# Patient Record
Sex: Female | Born: 1980
Health system: Southern US, Community
[De-identification: ages and names within clinical notes are randomized; demographics above are authoritative.]

## PROBLEM LIST (undated history)

## (undated) DIAGNOSIS — Z789 Other specified health status: Secondary | ICD-10-CM

## (undated) DIAGNOSIS — B009 Herpesviral infection, unspecified: Secondary | ICD-10-CM

## (undated) HISTORY — PX: ABDOMINAL HYSTERECTOMY: SHX81

---

## 1999-08-23 ENCOUNTER — Other Ambulatory Visit: Admission: RE | Admit: 1999-08-23 | Discharge: 1999-08-23 | Payer: Self-pay | Admitting: Obstetrics

## 1999-09-14 ENCOUNTER — Inpatient Hospital Stay (HOSPITAL_COMMUNITY): Admission: AD | Admit: 1999-09-14 | Discharge: 1999-09-14 | Payer: Self-pay | Admitting: Obstetrics

## 1999-09-14 ENCOUNTER — Emergency Department (HOSPITAL_COMMUNITY): Admission: EM | Admit: 1999-09-14 | Discharge: 1999-09-14 | Payer: Self-pay | Admitting: *Deleted

## 1999-12-28 ENCOUNTER — Inpatient Hospital Stay (HOSPITAL_COMMUNITY): Admission: AD | Admit: 1999-12-28 | Discharge: 1999-12-28 | Payer: Self-pay | Admitting: Obstetrics

## 2000-02-05 ENCOUNTER — Encounter: Payer: Self-pay | Admitting: Obstetrics

## 2000-02-05 ENCOUNTER — Inpatient Hospital Stay (HOSPITAL_COMMUNITY): Admission: AD | Admit: 2000-02-05 | Discharge: 2000-02-05 | Payer: Self-pay | Admitting: Obstetrics

## 2000-03-01 ENCOUNTER — Inpatient Hospital Stay (HOSPITAL_COMMUNITY): Admission: AD | Admit: 2000-03-01 | Discharge: 2000-03-05 | Payer: Self-pay | Admitting: *Deleted

## 2000-03-01 ENCOUNTER — Encounter (INDEPENDENT_AMBULATORY_CARE_PROVIDER_SITE_OTHER): Payer: Self-pay | Admitting: Specialist

## 2000-03-17 ENCOUNTER — Inpatient Hospital Stay (HOSPITAL_COMMUNITY): Admission: AD | Admit: 2000-03-17 | Discharge: 2000-03-17 | Payer: Self-pay | Admitting: Obstetrics

## 2000-08-15 ENCOUNTER — Emergency Department (HOSPITAL_COMMUNITY): Admission: EM | Admit: 2000-08-15 | Discharge: 2000-08-16 | Payer: Self-pay | Admitting: Emergency Medicine

## 2000-10-11 ENCOUNTER — Other Ambulatory Visit: Admission: RE | Admit: 2000-10-11 | Discharge: 2000-10-11 | Payer: Self-pay | Admitting: Obstetrics

## 2001-02-18 ENCOUNTER — Encounter: Payer: Self-pay | Admitting: Obstetrics

## 2001-02-18 ENCOUNTER — Inpatient Hospital Stay (HOSPITAL_COMMUNITY): Admission: AD | Admit: 2001-02-18 | Discharge: 2001-02-18 | Payer: Self-pay | Admitting: Obstetrics

## 2001-03-07 ENCOUNTER — Ambulatory Visit (HOSPITAL_COMMUNITY): Admission: AD | Admit: 2001-03-07 | Discharge: 2001-03-07 | Payer: Self-pay | Admitting: Obstetrics

## 2001-03-07 ENCOUNTER — Encounter (INDEPENDENT_AMBULATORY_CARE_PROVIDER_SITE_OTHER): Payer: Self-pay | Admitting: *Deleted

## 2002-05-01 ENCOUNTER — Encounter: Payer: Self-pay | Admitting: Obstetrics

## 2002-05-01 ENCOUNTER — Ambulatory Visit (HOSPITAL_COMMUNITY): Admission: RE | Admit: 2002-05-01 | Discharge: 2002-05-01 | Payer: Self-pay | Admitting: Obstetrics

## 2002-05-18 ENCOUNTER — Inpatient Hospital Stay (HOSPITAL_COMMUNITY): Admission: AD | Admit: 2002-05-18 | Discharge: 2002-05-22 | Payer: Self-pay | Admitting: Obstetrics

## 2002-05-19 ENCOUNTER — Encounter: Payer: Self-pay | Admitting: Obstetrics

## 2003-11-25 ENCOUNTER — Inpatient Hospital Stay (HOSPITAL_COMMUNITY): Admission: RE | Admit: 2003-11-25 | Discharge: 2003-11-28 | Payer: Self-pay | Admitting: Obstetrics

## 2005-02-13 ENCOUNTER — Emergency Department (HOSPITAL_COMMUNITY): Admission: EM | Admit: 2005-02-13 | Discharge: 2005-02-13 | Payer: Self-pay | Admitting: Emergency Medicine

## 2006-05-04 ENCOUNTER — Ambulatory Visit: Payer: Self-pay | Admitting: Obstetrics and Gynecology

## 2006-05-04 ENCOUNTER — Inpatient Hospital Stay (HOSPITAL_COMMUNITY): Admission: AD | Admit: 2006-05-04 | Discharge: 2006-05-04 | Payer: Self-pay | Admitting: Obstetrics

## 2006-07-25 ENCOUNTER — Inpatient Hospital Stay (HOSPITAL_COMMUNITY): Admission: RE | Admit: 2006-07-25 | Discharge: 2006-07-28 | Payer: Self-pay | Admitting: Obstetrics

## 2010-05-24 ENCOUNTER — Ambulatory Visit (HOSPITAL_COMMUNITY)
Admission: RE | Admit: 2010-05-24 | Discharge: 2010-05-24 | Payer: Self-pay | Source: Home / Self Care | Attending: Obstetrics | Admitting: Obstetrics

## 2010-06-27 ENCOUNTER — Encounter: Payer: Self-pay | Admitting: Obstetrics

## 2010-10-22 NOTE — H&P (Signed)
[facility] of [facility]  Patient:    Lisa Johnson, Lisa Johnson                       MRN: [id number] Adm. Date:  [id number] Attending:  Venita Sheffield                         History and Physical  HISTORY OF PRESENT ILLNESS:   The patient is a 30 year old prima gravida with an [facility] of February 24, 2000 who is in for induction of labor.  She is 1 cm, 50% with a vertex and -2.  Her group B Strep was negative.  She is contracting every four to five minutes.  Amniotomy was performed at 8:30 A.M. on September 26th and she was started in Pitocin stimulation at noon.  Patient contracted two to three minutes apart with quality throughout the day and evening.  By 6 P.M. she was 4 cm and at that time was on 5 U/ml of Pitocin. By 12:30 A.M. on September 27 after having been in good labor throughout the night she was still 4 cm, 90% with a vertex and -2.  It was decided she should deliver by C section because of failure to progress in labor.  PHYSICAL EXAMINATION:  GENERAL APPEARANCE:           Physical exam reveals a well-developed female in labor.  HEENT:                        Negative.  LUNGS:                        Clear.  HEART:                        Regular rhythm.  No murmurs.  No gallops.  ABDOMEN:                      Term size uterus.  Estimated fetal weight is 7 pounds.  PELVIC:                       As described above.  EXTREMITIES:                  Negative. DD:  03/02/00 TD:  03/02/00 Job: [id number]

## 2010-10-22 NOTE — Discharge Summary (Signed)
Lisa Johnson, Lisa Johnson              ACCOUNT NO.:  [id number]   MEDICAL RECORD NO.:  [id number]          PATIENT TYPE:  INP   LOCATION:  9135                          F[facility]ILITY:  [facility]   PHYSICIAN:  Kathreen Cosier, M.D.DATE OF BIRTH:  04/07/1981   DATE OF ADMISSION:  07/25/2006  DATE OF DISCHARGE:  07/28/2006                               DISCHARGE SUMMARY   HISTORY OF PRESENT ILLNESS:  The patient is a 30 year old gravida 5,  para 3-0-1-2, [facility] August 04, 2006.  She had previous C-section and she  was in for repeat C-section.  She had positive GBS.  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 daily.  She also had limited  prenatal care.  Per history, she is a regular marijuana user, and she  has had three previous C-sections.  The patient underwent repeat low  transverse cesarean section.  She had a female with Apgars 9 and 9  weighing 6 pounds 6 ounces.  Postoperatively, she did well.   LABORATORY DATA:  Hemoglobin was 11.4, platelets 228, postop hemoglobin  10.1, platelets 262, PT/PTT normal.  Sodium 136, potassium 3.9, chloride  103.  Urinalysis negative.  HIV negative.   DISPOSITION:  She was discharged home on postop day #3 on a regular diet  and Tylox for pain.  She is to see me in six weeks.   DISCHARGE DIAGNOSES:  Status post repeat low transverse cesarean section  at term.           ______________________________  Kathreen Cosier, M.D.     BAM/MEDQ  D:  08/16/2006  T:  08/17/2006  Job:  [id number]

## 2010-10-22 NOTE — Op Note (Signed)
NAMESALIAH, CRISP              ACCOUNT NO.:  [id number]   MEDICAL RECORD NO.:  [id number]          PATIENT TYPE:  INP   LOCATION:  NA                            [facility]   PHYSICIAN:  Kathreen Cosier, M.D.DATE OF BIRTH:  1981-02-06   DATE OF PROCEDURE:  07/25/2006  DATE OF DISCHARGE:                               OPERATIVE REPORT   PREOPERATIVE DIAGNOSIS:  Previous cesarean section, at term, for repeat.   OPERATION:  Repeat cesarean section.   SURGEON:  Kathreen Cosier, M.D.   FIRST ASSISTANT:  Charles A. Clearance Coots, M.D.   ANESTHESIA:  Spinal.   DESCRIPTION OF PROCEDURE:  The patient was placed on the operating room  in a supine position after the spinal was administered by Dr. Arby Barrette,  abdomen prepped and draped, bladder emptied with a Foley catheter.  A  transverse suprapubic incision was made and carried down to the rectus  fascia, fascia cleaned and incised the length of the incision, rectus  muscles retracted laterally and peritoneum incised longitudinally.  A  transverse incision was made in the visceroperitoneum above the bladder  and bladder mobilized inferiorly.  A transverse lower uterine incision  was made and patient delivered from LOA position of a female, Apgars 9 and  9, weighing 6 pounds 6 ounces.  The team was in attendance.  The  placenta was anterior, removed manually and sent to Pathology, uterine  cavity cleaned with dry laps.  The uterine incision was closed in 1  layer with continuous suture of #1 chromic, hemostasis satisfactory,  bladder flap reattached with 2-0 chromic, uterus well-contracted, tubes  and ovaries normal.  The abdomen was closed in layers, the peritoneum  with continuous suture of 0 chromic, fascia with continuous suture of 0  Dexon and the skin closed with a subcuticular stitch of 4-0 Monocryl.   BLOOD LOSS:  500 mL.           ______________________________  Kathreen Cosier, M.D.     BAM/MEDQ  D:  07/25/2006   T:  07/25/2006  Job:  [id number]

## 2010-10-22 NOTE — Op Note (Signed)
[facility] of [facility]  Patient:    Lisa Johnson, Lisa Johnson Visit Number: [id number] MRN: [id number]          Service Type: Attending:  Kathreen Cosier, M.D. Dictated by:   Kathreen Cosier, M.D. Proc. Date: 03/07/01                             Operative Report  PREOPERATIVE DIAGNOSES:       Spontaneous incomplete abortion.  PROCEDURE:                    Dilatation and evacuation using MAC.  DESCRIPTION OF PROCEDURE:     Patient in lithotomy position.  Perineum and vagina prepped and draped.  Bladder emptied with straight catheter.  Bimanual examination revealed uterus up to 6-8 weeks size.  Weighted speculum placed in the vagina.  Anterior lip of the cervix grasped with a tenaculum.  Cervix ______ opened and the cavity was found at 9 cm.  Using a #9 suction the endometrial contents were aspirated until the cavity was clean.  Patient tolerated procedure well.  Taken to recovery room in good condition. Dictated by:   Kathreen Cosier, M.D. Attending:  Kathreen Cosier, M.D. DD:  03/07/01 TD:  03/07/01 Job: [id number]

## 2010-10-22 NOTE — Discharge Summary (Signed)
[facility] of [facility]  Patient:    Lisa Johnson, Lisa Johnson                       MRN: [id number] Adm. Date:  [id number] Disc. Date: [id number] Attending:  Shelba Flake                           Discharge Summary  HISTORY OF PRESENT ILLNESS:   The patient is a 30 year old primigravida, [facility] February 24, 2000, who was admitted for induction of labor.  She was 1 cm, vertex -2, Strep negative.  HOSPITAL COURSE:              The patient underwent a primary low transverse cesarean section because of failure to progress in labor and had a 7 pound 15 ounce female, Apgars 8 and 9.  She did well except for temperature elevation postoperatively which was treated with ampicillin and gentamicin IV.  On admission her hemoglobin was 10.8, postoperative 8.1, white count 14.4 and 14.5, platelets 227 and 203.  Her lab tests shows positive ______.  The patient defervesed by day #4 and demanded to be discharged.  She was discharged home on ampicillin 500 p.o. q.6h. and to be followed up in my office two days postdischarge.  DISCHARGE DIAGNOSIS:          Status post primary low transverse cesarean section at term for failure to progress in labor. DD:  08/17/00 TD:  08/17/00 Job: [id number]

## 2010-10-22 NOTE — Op Note (Signed)
NAMECASARA, PERRIER                        ACCOUNT NO.:  [id number]   MEDICAL RECORD NO.:  [id number]                   PATIENT TYPE:  INP   LOCATION:  9199                                 [facility]   PHYSICIAN:  Kathreen Cosier, M.D.           DATE OF BIRTH:  [birth year]   DATE OF PROCEDURE:  11/25/2003  DATE OF DISCHARGE:                                 OPERATIVE REPORT   PREOPERATIVE DIAGNOSES:  Previous cesarean section at term x2 desiring  repeat.   POSTOPERATIVE DIAGNOSES:  Previous cesarean section at term x2 desiring  repeat.   SURGEON:  Kathreen Cosier, M.D.   FIRST ASSISTANT:  Charles A. Clearance Coots, M.D.   ANESTHESIA:  Spinal.   DESCRIPTION OF PROCEDURE:  The patient on the operating table in the supine  position. After the spinal administered, abdomen prepped and draped, bladder  emptied with a Foley catheter. A transverse suprapubic incision made through  the old scar and carried down to the rectus fascia.  The fascia cleaned and  incised the length of the incision. The recti muscles were retracted  laterally, peritoneum incised longitudinally. A transverse incision made in  the visceroperitoneum above the bladder and the bladder mobilized  inferiorly. A transverse lower uterine incision made, the fluid was clear.  The patient delivered from the LOA position of a female with Apgar 9, 9  weighing 7 pounds 1 ounce. The placenta was posterior, removed manually.  The uterine cavity cleaned with dry laps.  The uterine incision closed in  one layer with continuous suture of #1 chromic, hemostasis was satisfactory.  The bladder flap reattached with 2-0 chromic, uterus was retracted,  tubes  and ovaries normal. Abdomen closed in layers, peritoneum continuous with  __________ chromic, fascia continuous suture of #0 Dexon and the skin closed  with subcuticular stitch of 3-0 Monocryl.  Blood loss 600 mL.                                               Kathreen Cosier, M.D.    BAM/MEDQ  D:  11/25/2003  T:  11/25/2003  Job:  [id number]

## 2010-10-22 NOTE — H&P (Signed)
   NAME:  Lisa Johnson, Lisa Johnson                        ACCOUNT NO.:  [id number]   MEDICAL RECORD NO.:  [id number]                   PATIENT TYPE:  INP   LOCATION:  9134                                 [facility]   PHYSICIAN:  Kathreen Cosier, M.D.           DATE OF BIRTH:  1980-07-24   DATE OF ADMISSION:  05/18/2002  DATE OF DISCHARGE:                                HISTORY & PHYSICAL   HISTORY OF PRESENT ILLNESS:  The patient is a 30 year old gravida 3, para 1-  0-0-1 who had a previous cesarean section.  Her EDC was May 18, 2002,  and she was brought in for elective induction.  She had positive GBS treated  in the office with ampicillin for a total of five weeks.  Positive herpes,  on Valtrex for 36 weeks with no outbreaks.   On admission, her cervix was 1-2 cm, 50% vertex, -3 and cervix very  posterior.  Artificial rupture of membranes was performed at 8 p.m.  The  patient received Pitocin stimulation on May 19, 2002,  and she was 4 cm, 80%, vertex -1.  Fluid was clear.  She had an epidural.  By 9:30 p.m., she was 5 cm, 100%, with the vertex at -1.  She was having  some variables and some lates.  The IUPC was inserted, and amnioinfusion  began.  The lates and variables disappeared.  By 11:30 a.m., the cervix was  still 5, what was 100 was now 70%, vertex -1.  She started having some late  decelerations again, and there had been no change in the cervix in the past  five hours  It was decided that she would be delivered by a repeat C-section  for failure to progress and nonreassuring fetal heart rate tracing.   PHYSICAL EXAMINATION:  GENERAL:  Well-developed female in labor.  HEENT:  Negative.  LUNGS:  Clear.  HEART:  Regular rhythm.  No murmurs or gallops.  ABDOMEN:  Term size uterus.  PELVIC:  As described above.  EXTREMITIES:  Negative.                                                Kathreen Cosier, M.D.    BAM/MEDQ  D:  05/19/2002  T:  05/19/2002  Job:   [id number]

## 2010-10-22 NOTE — Discharge Summary (Signed)
Lisa Johnson, Lisa Johnson                        ACCOUNT NO.:  [id number]   MEDICAL RECORD NO.:  [id number]                   PATIENT TYPE:  INP   LOCATION:  9108                                 [facility]   PHYSICIAN:  Kathreen Cosier, M.D.           DATE OF BIRTH:  1980-09-29   DATE OF ADMISSION:  11/25/2003  DATE OF DISCHARGE:                                 DISCHARGE SUMMARY   HOSPITAL COURSE:  The patient is a 30 year old gravida 4 para 2-0-1-2 with  [facility] November 27, 2003.  She had two previous C-sections and she had limited  prenatal care.  She was admitted for repeat C-section.  The patient had a  repeat low transverse cesarean section, had a female, Apgar 9 and 9,  weighing 7 pounds 10 ounces.  Postoperatively she did well.  She was  discharged on postoperative day #3 ambulatory, on a regular diet.  Her  hemoglobin was 11.6.   DISCHARGE MEDICATIONS:  Depo-Proveral and Tylox.                                               Kathreen Cosier, M.D.    BAM/MEDQ  D:  11/28/2003  T:  11/29/2003  Job:  [id number]

## 2010-10-22 NOTE — Discharge Summary (Signed)
   NAMESIMORA, DINGEE                        ACCOUNT NO.:  [id number]   MEDICAL RECORD NO.:  [id number]                   PATIENT TYPE:  INP   LOCATION:  9128                                 [facility]   PHYSICIAN:  Kathreen Cosier, M.D.           DATE OF BIRTH:  November 08, 1980   DATE OF ADMISSION:  05/18/2002  DATE OF DISCHARGE:  05/22/2002                                 DISCHARGE SUMMARY   HISTORY OF PRESENT ILLNESS:  The patient is a 30 year old gravida 3, para 1-  0-0-1, EDD of 05/18/02 who had a previous C-section, scheduled for elective  induction at term.   HOSPITAL COURSE:  The patient progressed to 5 cm and her progress ceased and  she underwent a repeat low transverse cesarean section, had a female, Apgars  of 8 and 9, weighing 7 pounds 3 ounces from the OP position.  Placenta was  anterior.  Her hemoglobin on admission was 10, postoperative 7.9.  She was  discharged home on the third postoperative day and tolerating a regular diet  on Tylox one to two every three to four hours and ferrous sulfate 325, to  see me in six weeks. She also received Depakote 150 mg IM.   DISCHARGE DIAGNOSIS:  Status post repeat low transverse cesarean section.                                               Kathreen Cosier, M.D.    BAM/MEDQ  D:  05/22/2002  T:  05/23/2002  Job:  [id number]

## 2010-10-22 NOTE — Op Note (Signed)
   NAMEVERNELLA, Lisa Johnson                        ACCOUNT NO.:  [id number]   MEDICAL RECORD NO.:  [id number]                   PATIENT TYPE:  INP   LOCATION:  9134                                 [facility]   PHYSICIAN:  Kathreen Cosier, M.D.           DATE OF BIRTH:  27-Dec-1980   DATE OF PROCEDURE:  05/19/2002  DATE OF DISCHARGE:                                 OPERATIVE REPORT   PREOPERATIVE DIAGNOSES:  1. Failure to progress.  2. Nonreassuring fetal heart rate tracing.   ANESTHESIA:  Epidural.   DESCRIPTION OF PROCEDURE:  The patient was placed on the operating table in  supine position.  Abdomen prepped and draped, the bladder emptied with a  Foley catheter.  A transverse suprapubic incision made, carried down to the  rectus fascia.  The fascia cleaned and incised the length of the incision.  The recti muscles retracted laterally.  The peritoneum was advanced  longitudinally.  A transverse incision made in the visceral peritoneum above  the bladder and the bladder mobilized.  A transverse low uterine incision  made.  The patient was delivered from the OP position of a female, Apgar 8 and  9, weighing 7 pounds 7 ounces.  The placenta was anterior and removed  manually.  The uterine cavity cleaned with dry laps.  The uterine incision  closed in our layer with continuous suture of #1 chromic.  Hemostasis was  satisfactory.  Bladder flap reattached with 2-0 chromic.  The uterus well-  contracted, tubes and ovaries normal.  Abdomen closed in layers, the  peritoneum continuous suture of 0 chromic, the fascia continuous suture of 0  Dexon, and the skin closed with subcuticular suture of 3-0 plain.  The  patient tolerated the procedure well, taken to the recovery room in good  condition.                                               Kathreen Cosier, M.D.    BAM/MEDQ  D:  05/19/2002  T:  05/19/2002  Job:  [id number]

## 2010-10-22 NOTE — Op Note (Signed)
[facility] of [facility]  Patient:    Lisa Johnson, Lisa Johnson                       MRN: [id number] Proc. Date: 03/02/00 Adm. Date:  [id number] Attending:  Venita Sheffield                           Operative Report  PREOPERATIVE DIAGNOSIS:       Failure to progress in labor.  POSTOPERATIVE DIAGNOSIS:      Failure to progress in labor.  PROCEDURE:                    Transverse cesarean section.  SURGEON:                      Kathreen Cosier, M.D.  ANESTHESIA:                   Epidural.  DESCRIPTION OF PROCEDURE:     The patient was taken to the operating room and placed on the table in supine position.  The abdomen was prepped and draped. The bladder was emptied with a Foley catheter.  A transverse suprapubic incision was made and carried down to the rectus fascia.  The fascia was cleanly incised and the erectile muscles were retracted laterally.  The peritoneum was incised longitudinally.  A transverse incision was then made in the viscera peritoneum above the bladder, and the bladder mobilized inferiorly.  A transverse lower uterine incision was made.  The patient was then delivered from the OP position of a female Apgar 8, 9 weighing 7 pounds 15 ounces.  The fluid was cleared.  The pediatric team was in attendance.  There was a loose loop of cord.  The placenta was anterior and removed manually.  Uterine cavity was cleaned with dry laps.  The uterine incision was closed with continuous suture of #1 chromic, including myometrium and endometrium.  The myometrium was closed in one layer.  Hemostasis was satisfactory.  The bladder flap was reattached with 2-0 chromic.  The uterus was contracted.  The tubes and ovaries were normal.  The abdomen was closed in layers.   The peritoneum in continuous 0 chromic.  Subcutaneous with 2-0 Dexon, and the skin closed with subcuticular stitch of 3-0 plain.  It was noted that there was a small suprapubic inclusion  cyst; using a scalpel this was removed and then closed with one suture of 4-0 Dexon.  ESTIMATED BLOOD LOSS:  Total 500 cc.  DISPOSITION:  The patient tolerated the procedure well.   DD:  03/02/00 TD:  03/02/00 Job: [id number]

## 2011-01-28 ENCOUNTER — Emergency Department (HOSPITAL_COMMUNITY)
Admission: EM | Admit: 2011-01-28 | Discharge: 2011-01-28 | Disposition: A | Discharge status: Home / Self Care | Payer: Medicaid Other | Attending: Emergency Medicine | Admitting: Emergency Medicine

## 2011-01-28 DIAGNOSIS — IMO0002 Reserved for concepts with insufficient information to code with codable children: Secondary | ICD-10-CM | POA: Insufficient documentation

## 2011-01-28 DIAGNOSIS — L732 Hidradenitis suppurativa: Secondary | ICD-10-CM | POA: Insufficient documentation

## 2011-01-30 ENCOUNTER — Emergency Department (HOSPITAL_COMMUNITY)
Admission: EM | Admit: 2011-01-30 | Discharge: 2011-01-30 | Discharge status: Against Medical Advice | Payer: Medicaid Other | Attending: Emergency Medicine | Admitting: Emergency Medicine

## 2011-01-30 DIAGNOSIS — Z0389 Encounter for observation for other suspected diseases and conditions ruled out: Secondary | ICD-10-CM | POA: Insufficient documentation

## 2012-08-28 ENCOUNTER — Other Ambulatory Visit (HOSPITAL_COMMUNITY): Payer: Self-pay | Admitting: Obstetrics

## 2012-08-28 DIAGNOSIS — Z3480 Encounter for supervision of other normal pregnancy, unspecified trimester: Secondary | ICD-10-CM

## 2012-08-28 DIAGNOSIS — D219 Benign neoplasm of connective and other soft tissue, unspecified: Secondary | ICD-10-CM

## 2012-09-03 ENCOUNTER — Ambulatory Visit (HOSPITAL_COMMUNITY)
Admission: RE | Admit: 2012-09-03 | Discharge: 2012-09-03 | Disposition: A | Discharge status: Home / Self Care | Payer: Medicaid Other | Source: Ambulatory Visit | Attending: Obstetrics | Admitting: Obstetrics

## 2012-09-03 ENCOUNTER — Encounter (HOSPITAL_COMMUNITY): Payer: Self-pay

## 2012-09-03 ENCOUNTER — Other Ambulatory Visit (HOSPITAL_COMMUNITY): Payer: Self-pay | Admitting: Obstetrics

## 2012-09-03 DIAGNOSIS — Z3480 Encounter for supervision of other normal pregnancy, unspecified trimester: Secondary | ICD-10-CM

## 2012-09-03 DIAGNOSIS — D219 Benign neoplasm of connective and other soft tissue, unspecified: Secondary | ICD-10-CM

## 2012-09-03 DIAGNOSIS — O3410 Maternal care for benign tumor of corpus uteri, unspecified trimester: Secondary | ICD-10-CM | POA: Insufficient documentation

## 2012-09-03 DIAGNOSIS — Z3689 Encounter for other specified antenatal screening: Secondary | ICD-10-CM | POA: Insufficient documentation

## 2012-09-03 DIAGNOSIS — D259 Leiomyoma of uterus, unspecified: Secondary | ICD-10-CM | POA: Insufficient documentation

## 2012-09-11 ENCOUNTER — Ambulatory Visit (HOSPITAL_COMMUNITY): Payer: Medicaid Other

## 2013-05-24 ENCOUNTER — Other Ambulatory Visit: Payer: Self-pay | Admitting: Internal Medicine

## 2013-05-24 DIAGNOSIS — R109 Unspecified abdominal pain: Secondary | ICD-10-CM

## 2013-06-17 ENCOUNTER — Ambulatory Visit
Admission: RE | Admit: 2013-06-17 | Discharge: 2013-06-17 | Disposition: A | Discharge status: Home / Self Care | Payer: Medicaid Other | Source: Ambulatory Visit | Attending: Internal Medicine | Admitting: Internal Medicine

## 2013-06-17 DIAGNOSIS — R109 Unspecified abdominal pain: Secondary | ICD-10-CM

## 2013-08-03 ENCOUNTER — Encounter (HOSPITAL_COMMUNITY): Payer: Self-pay | Admitting: Emergency Medicine

## 2013-08-03 ENCOUNTER — Emergency Department (HOSPITAL_COMMUNITY): Payer: Medicaid Other

## 2013-08-03 ENCOUNTER — Emergency Department (HOSPITAL_COMMUNITY)
Admission: EM | Admit: 2013-08-03 | Discharge: 2013-08-03 | Disposition: A | Discharge status: Home / Self Care | Payer: Medicaid Other | Attending: Emergency Medicine | Admitting: Emergency Medicine

## 2013-08-03 DIAGNOSIS — S46909A Unspecified injury of unspecified muscle, fascia and tendon at shoulder and upper arm level, unspecified arm, initial encounter: Secondary | ICD-10-CM | POA: Insufficient documentation

## 2013-08-03 DIAGNOSIS — Y9389 Activity, other specified: Secondary | ICD-10-CM | POA: Insufficient documentation

## 2013-08-03 DIAGNOSIS — Y92410 Unspecified street and highway as the place of occurrence of the external cause: Secondary | ICD-10-CM | POA: Insufficient documentation

## 2013-08-03 DIAGNOSIS — M25519 Pain in unspecified shoulder: Secondary | ICD-10-CM

## 2013-08-03 DIAGNOSIS — S4980XA Other specified injuries of shoulder and upper arm, unspecified arm, initial encounter: Secondary | ICD-10-CM | POA: Insufficient documentation

## 2013-08-03 DIAGNOSIS — F17200 Nicotine dependence, unspecified, uncomplicated: Secondary | ICD-10-CM | POA: Insufficient documentation

## 2013-08-03 MED ORDER — OXYCODONE-ACETAMINOPHEN 5-325 MG PO TABS
1.0000 | ORAL_TABLET | Freq: Once | ORAL | Status: AC
  Administered 2013-08-03: 1 via ORAL
  Filled 2013-08-03: qty 1

## 2013-08-03 MED ORDER — IBUPROFEN 600 MG PO TABS: 600.0000 mg | ORAL_TABLET | Freq: Four times a day (QID) | ORAL | Status: AC | PRN

## 2013-08-03 MED ORDER — OXYCODONE-ACETAMINOPHEN 5-325 MG PO TABS: 1.0000 | ORAL_TABLET | Freq: Four times a day (QID) | ORAL | Status: AC | PRN

## 2013-08-03 NOTE — Discharge Instructions (Signed)
Return to the ED with any concerns including increased pain, swelling/discoloration/numbness of hand or fingers, or any other alarming symptoms
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
## 2014-02-12 ENCOUNTER — Telehealth: Payer: Self-pay | Admitting: *Deleted

## 2014-02-12 NOTE — Telephone Encounter (Signed)
Called pt to [facility] appt for new ob and pt stated to have an appt in another location on 09/22.  [place]

## 2014-02-21 ENCOUNTER — Ambulatory Visit (HOSPITAL_COMMUNITY): Payer: No Typology Code available for payment source

## 2014-02-25 ENCOUNTER — Other Ambulatory Visit (HOSPITAL_COMMUNITY): Payer: Self-pay | Admitting: Obstetrics

## 2014-02-25 ENCOUNTER — Other Ambulatory Visit: Payer: Self-pay

## 2014-02-25 ENCOUNTER — Ambulatory Visit (HOSPITAL_COMMUNITY)
Admission: RE | Admit: 2014-02-25 | Discharge: 2014-02-25 | Disposition: A | Discharge status: Home / Self Care | Payer: Medicaid Other | Source: Ambulatory Visit | Attending: Obstetrics | Admitting: Obstetrics

## 2014-02-25 ENCOUNTER — Encounter (HOSPITAL_COMMUNITY): Payer: Self-pay

## 2014-02-25 DIAGNOSIS — E7529 Other sphingolipidosis: Secondary | ICD-10-CM

## 2014-02-25 DIAGNOSIS — O09529 Supervision of elderly multigravida, unspecified trimester: Secondary | ICD-10-CM | POA: Diagnosis not present

## 2014-02-25 DIAGNOSIS — O352XX0 Maternal care for (suspected) hereditary disease in fetus, not applicable or unspecified: Secondary | ICD-10-CM | POA: Diagnosis present

## 2014-02-25 DIAGNOSIS — O352XX1 Maternal care for (suspected) hereditary disease in fetus, fetus 1: Secondary | ICD-10-CM

## 2014-02-25 LAB — FRAGILE X FOR MFM (SENT TO WFUBMC)

## 2014-02-27 ENCOUNTER — Telehealth (HOSPITAL_COMMUNITY): Payer: Self-pay | Admitting: MS"

## 2014-02-27 NOTE — Telephone Encounter (Signed)
Left message for patient to return call to discuss additional information following up from her visit on Tuesday.   Lisa Johnson 02/27/2014 10:46 AM

## 2014-02-27 NOTE — Telephone Encounter (Signed)
Called Ms. 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 Discussed that given that the lab is in [place], they do not accept [place] Medicaid, thus the patient may be billed directly for the testing. Reviewed that the test costs $990. Ms. Kenealy stated that she does not have that amount of money to pay for the testing and that she guessed she would be "waiting it out." Discussed that I could inquire about payment plan, if desired. Ms. Silveria asked if we could still test for the other conditions. Reviewed that chromosome analysis would be performed through [facility]. Also discussed the option of noninvasive prenatal screening (NIPS) for chromosome conditions and the benefits and limitations compared to [facility]. Ms. Mcquown stated that she needed time to think over these options and would call me back. Reviewed that she can also still come in tomorrow for the scheduled time and that we can review this information in person.   [name] Patt Steinhardt 02/27/2014 4:13 PM

## 2014-02-27 NOTE — Telephone Encounter (Signed)
Patient returned call. Discussed with Ms. Lisa Johnson that I was calling to follow up regarding records of her son, [name], genetic testing results. 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 Per telephone conversation with the lab director, [name], [name]'s blood that was also submitted with her son's was positive for the second identified mutation. [name] blood that was submitted with their son's sample was negative for both mutations, per Dr. Carren Rang verbal report. We discussed that he does not appear to be a carrier for MLD according to the testing performed in this laboratory. We reviewed that various explanations for this include nonpaternity, de novo mutation, and germ line mosaicism. [name] stated that he is definitely the dad for Kendred and the current pregnancy. We reviewed that given that he reportedly was not found to carry either mutation, the pregnancy would not be expected to have a 1 in 4 (25%) chance for MLD. Recurrence risk would be expected to be very low. We reviewed that molecular testing can still be performed on CVS (or amniocentesis) if desired, and that a different laboratory, [facility] in [place] is able to perform prenatal testing. We reviewed the 1 in 812 risk for complications from CVS. We discussed that each of her children have a 1 in 2 (50%) chance to be a carrier. [name] stated that she would like to proceed with CVS given that she is still anxious about the risk for recurrence.   [name] [name] 02/27/2014 4:09 PM

## 2014-02-27 NOTE — Telephone Encounter (Signed)
Called Ms. Tiernan L Bourque back regarding billing for [facility]S for metachromatic leukodystrophy and explained that we can do institutional billing for the sample. She stated that she is definitely still interested in doing CVS given that "she has it." Reviewed that if she is a carrier but if the father of the pregnancy is not, then the pregnancy would not likely be at increased risk for MLD. However, reviewed that if she pursues CVS, the lab performing molecular testing for MLD also plans to test her and father of the pregnancy's blood for the reported identified mutations to confirm the previous report. Parental samples are used as positive controls. Thus, [name], the father of the pregnancy would need to submit blood. The patient indicated that he may not be able to come tomorrow. Reviewed that it can be performed at a later date given that cells will first be cultured and won't be sent to for MLD testing for approximately 2 weeks. Patient had no additional questions at this time.   [name] Nera Haworth 02/27/2014 4:44 PM

## 2014-02-28 ENCOUNTER — Other Ambulatory Visit: Payer: Self-pay

## 2014-02-28 ENCOUNTER — Ambulatory Visit (HOSPITAL_COMMUNITY)
Admission: RE | Admit: 2014-02-28 | Discharge: 2014-02-28 | Disposition: A | Discharge status: Home / Self Care | Payer: Medicaid Other | Source: Ambulatory Visit | Attending: Obstetrics | Admitting: Obstetrics

## 2014-02-28 ENCOUNTER — Encounter (HOSPITAL_COMMUNITY): Payer: Self-pay

## 2014-02-28 ENCOUNTER — Telehealth (HOSPITAL_COMMUNITY): Payer: Self-pay | Admitting: MS"

## 2014-02-28 VITALS — BP 110/70 | HR 92 | Wt 176.0 lb

## 2014-02-28 DIAGNOSIS — O9989 Other specified diseases and conditions complicating pregnancy, childbirth and the puerperium: Secondary | ICD-10-CM

## 2014-02-28 DIAGNOSIS — O99891 Other specified diseases and conditions complicating pregnancy: Secondary | ICD-10-CM | POA: Insufficient documentation

## 2014-02-28 DIAGNOSIS — O352XX0 Maternal care for (suspected) hereditary disease in fetus, not applicable or unspecified: Secondary | ICD-10-CM | POA: Diagnosis present

## 2014-02-28 DIAGNOSIS — E7529 Other sphingolipidosis: Secondary | ICD-10-CM | POA: Insufficient documentation

## 2014-02-28 DIAGNOSIS — O352XX1 Maternal care for (suspected) hereditary disease in fetus, fetus 1: Secondary | ICD-10-CM

## 2014-02-28 DIAGNOSIS — G3180 Leukodystrophy, unspecified: Secondary | ICD-10-CM | POA: Insufficient documentation

## 2014-02-28 LAB — ROUTINE CHROMOSOME - KARYOTYPE

## 2014-02-28 NOTE — Telephone Encounter (Signed)
Called Mr. Lisa Johnson, the father of the pregnancy to review that paternal blood samples are needed with the CVS performed this morning given that molecular testing is being performed. He stated that he could probably come by the beginning of next week and asked if he could call our office when he is able to come. Provided him with the contact number and discussed that our office hours are 8-5. Encouraged him to contact me with any questions.   [name] Chayah Mckee 02/28/2014 10:54 AM

## 2014-03-04 ENCOUNTER — Telehealth (HOSPITAL_COMMUNITY): Payer: Self-pay | Admitting: MS"

## 2014-03-04 ENCOUNTER — Encounter (HOSPITAL_COMMUNITY): Payer: Self-pay

## 2014-03-04 DIAGNOSIS — O352XX0 Maternal care for (suspected) hereditary disease in fetus, not applicable or unspecified: Secondary | ICD-10-CM | POA: Insufficient documentation

## 2014-03-04 NOTE — Progress Notes (Signed)
Genetic Counseling  High-Risk Gestation Note  Appointment Date:  02/25/2014 Referred By: Frederico Hamman, MD Date of Birth:  03/21/1981   Pregnancy History: [id number] Estimated Date of Delivery: 09/02/14 Estimated Gestational Age: [redacted]w[redacted]d  I met with Lisa Johnson genetic counseling because of a previous child with metachromatic leukodystrophy. She was accompanied by her sister to today's visit. Ms. CPartridgewill be 33years old at delivery.    We began by reviewing the family history in detail. The patient reported that her son, Lisa Johnson was born in 2008 and was diagnosed with metachromatic leukodystrophy at age 33years. He was diagnosed and followed by neurology. He died at age 35and a h35years old. She reported that this was the third child together with her current partner, Lisa Johnson Their second child, a daughter, died from Sudden Infant Death syndrome. She gave u[place]oreapermission to obtain and review medical records from her son's treatment, which confirmed the reported diagnosis. Medical records indicated that his diagnosis was determined through enzyme analysis and confirmed through urine analysis 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 No additional relatives were reported with metachromatic leukodystrophy. The patient reported that she and Mr. KGeorgina Peerare anxious about the possibility for MLD to occur again in a pregnancy, given this history.   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 Three clinical subtypes have been described: late-infantile MLD (50-60% of cases), juvenile MLD (20-30% of cases), and adult MLD (15-20% of cases). The age of onset has been reported to typically be similar within a family. Late-infantile MLD is characterized by onset between ages  one and two years. Characteristics may include weakness, hypotonia, clumsiness, toe walking, frequent falls, and slurred speech. Characteristics seen with progression of the condition include inability to stand, difficulty with speech, deteriorated mental function, hypertonia, arm and leg pain, seizures, poor vision and hearing, and peripheral neuropathy. Children in the final stages of MLD reportedly have tonic spasms, decerebrate posturing, and general unawareness of surroundings.   It is caused by a deficiency of arylsulfatase A (ARSA).  However, pseudodeficiency of ARSA also occurs. Thus, diagnosis of MLD is confirmed by urinary secretion of sulfatides, finding of metachromatic lipid deposits in nervous system tissue, and/or genetic testing of ARSA gene. MLD follows autosomal recessive inheritance, and ARSA is the only gene identified in which a mutation causes MLD. We reviewed genes, chromosomes, and autosomal recessive inheritance. A pregnancy is at risk to inherit the autosomal recessive condition if both parents are carriers of one copy of the nonworking gene change. Each pregnancy of a carrier couple has a 1 in 4 (25%) chance to be affected, 1 in 2 (50%) chance to be a carrier, and a 1 in 4 (25%) chance to be neither affected nor a carrier.    We discussed the option of prenatal diagnosis via chorionic villus sampling (CVS) at 10-[redacted] weeks gestation or amniocentesis (after [redacted] weeks gestation) for MLD, given that molecular testing previously identified causative mutations for the affected individual in the family. We reviewed the risks, benefits, and limitations of CVS and amniocentesis, including the associated 1 in 1701risk for complications for CVS and the 1 in 3779-390risk for complications from amniocentesis, including spontaneous pregnancy loss for both. We reviewed that karyotype analysis can also be performed via CVS and amniocentesis. We reviewed that CVS and  amniocentesis do not detect or rule  out all genetic conditions or birth defects. Additionally, when two parents are carriers and the mutations are identified, preimplantation genetic diagnosis would also be an option for a future pregnancy, meaning that embryo(s) obtained through in vitro fertilization would be tested for the familial mutations prior to the implantation of unaffected embryo(s) into the uterus. After careful consideration, Lisa Johnson elected to proceed with CVS for chromosome analysis and molecular testing for MLD (for the two mutations identified in her previous son). CVS was scheduled for 02/28/14 with Dr. Jolyn Lent in our office.   They were counseled regarding maternal age and the association with risk for chromosome conditions due to nondisjunction with aging of the ova.   We reviewed chromosomes, nondisjunction, and the associated 1 in 114 risk for fetal aneuploidy in the first trimester related to a maternal age of 33 years old at delivery.  They were counseled that the risk for aneuploidy decreases as gestational age increases, accounting for those pregnancies which spontaneously abort.  We specifically discussed Down syndrome (trisomy 53), trisomies 40 and 49, and sex chromosome aneuploidies (47,XXX and 47,XXY) including the common features and prognoses of each.   We reviewed available screening options including First Screen, Quad screen, noninvasive prenatal screening (NIPS)/cell free DNA (cfDNA) testing, and detailed ultrasound.  She was counseled that screening tests are used to modify a patient's a priori risk for aneuploidy, typically based on age. This estimate provides a pregnancy specific risk assessment. Given that [name] expressed interest in pursuing CVS for MLD testing and also chromosome analysis, general overview of these screening options for fetal aneuploidy were discussed. Detailed ultrasound is available to the patient at ~18+ weeks gestation.  She understands that screening tests cannot  rule out all birth defects or genetic syndromes.   Additionally in the family history, [name] reported a female maternal cousin with intellectual disability. This relative is related to the patient through all females. He is reportedly 35 years old. She has limited information regarding his specific features but stated that his mother had a tumor in her head when she was pregnant with him. The specific etiology is not known regarding his intellectual disability. Ms. Rosano was counseled that there are many different causes of intellectual disabilities including environmental, multifactorial, and genetic etiologies.  We discussed that a specific diagnosis for intellectual disability can be determined in approximately 50% of these individuals.  In the remaining 50% of individuals, a diagnosis may never be determined.  Regarding genetic causes, we discussed that chromosome aberrations (aneuploidy, deletions, duplications, insertions, and translocations) are responsible for a small percentage of individuals with intellectual disability.  Many individuals with chromosome aberrations have additional differences, including congenital anomalies or minor dysmorphisms.  Likewise, single gene conditions are the underlying cause of intellectual delay in some families.  We discussed that many gene conditions have intellectual disability as a feature, but also often include other physical or medical differences.  Specifically, we reviewed fragile X syndrome and the X-linked inheritance of this condition.  We discussed the option of FMR1 (the gene that when altered causes fragile X syndrome) analysis to determine whether Fragile X syndrome is the cause of intellectual disability in this family.  Ms. Wipperfurth elected to proceed with FMR1 analysis today. We discussed that without more specific information, it is difficult to provide an accurate risk assessment.  Further genetic counseling is warranted if more information is  obtained.  The family histories were otherwise found to  be noncontributory for birth defects, recurrent pregnancy loss, and known genetic conditions. Without further information regarding the provided family history, an accurate genetic risk cannot be calculated. Further genetic counseling is warranted if more information is obtained.  Ms. CHRISTIN MOLINE was provided with written information regarding sickle cell anemia (SCA) including the carrier frequency and incidence in the African-American population, the availability of carrier testing and prenatal diagnosis if indicated.  In addition, we discussed that hemoglobinopathies are routinely screened for as part of the [place] newborn screening panel.  Hemoglobin electrophoresis is available to her if not previously performed and if desired.   Ms. MYLANI GENTRY denied exposure to environmental toxins or chemical agents. She denied the use of tobacco or street drugs. She reported drinking alcohol prior to being aware of the pregnancy and none since she discovered the pregnancy. The all-or-none period was discussed, meaning exposures that occur in the first 4 weeks of gestation are typically thought to either not affect the pregnancy at all or result in a miscarriage.  Prenatal alcohol exposure can increase the risk for growth delays, small head size, heart defects, eye and facial differences, as well as behavior problems and learning disabilities. The risk of these to occur tends to increase with the amount of alcohol consumed. However, because there is no identified safe amount of alcohol in pregnancy, it is recommended to completely avoid alcohol in pregnancy. Given the reported amount of exposure, risk for associated effects are likely low in the current pregnancy. She denied significant viral illnesses during the course of her pregnancy. Her medical and surgical histories were contributory for "4 or 5" TABs.   I counseled Ms. Ahuva L Ziska regarding  the above risks and available options.  The approximate face-to-face time with the genetic counselor was 40 minutes.  Chipper Oman, MS Certified Genetic Counselor 03/04/2014

## 2014-03-04 NOTE — Telephone Encounter (Signed)
Spoke with Lisa Johnson, father of the pregnancy, regarding coming to our office for blood draw to accompany molecular CVS testing. He stated that he still has not figured out best time to come in. He said that evenings work best since he works first shift. Reminded him that our office is open until 5:00. He plans to review options with his job and call us back.   [name] 03/04/2014 12:35 PM

## 2014-03-06 ENCOUNTER — Telehealth (HOSPITAL_COMMUNITY): Payer: Self-pay | Admitting: MS"

## 2014-03-06 NOTE — Telephone Encounter (Signed)
Called Lisa Johnson to discuss the karyotype results from her CVS. We reviewed that these are within normal limits (46, XX).   She understands that this did not assess for single gene conditions and does not detect or rule out all birth defects. Reviewed that molecular testing for MLD is still pending. Cells are still culturing. Our lab estimates that they will be ready to send-out to the lab for MLD testing sometime next week. Reviewed that turnaround time should be within the next 3 weeks. Reviewed that paternal blood sample is requested by lab for molecular testing to make the testing more informative regarding the current pregnancy. Have talked with [name] twice and he has not been able to find a time that works with his work schedule to come for the blood draw.  [name] stated that she would talk with him about coming for a blood draw. Patient reported that she has not noticed any concerning symptoms or complications following the procedure.  All questions were answered to her satisfaction, she was encouraged to call with additional questions or concerns.  Chipper Oman, MS Insurance risk surveyor

## 2014-03-06 NOTE — Telephone Encounter (Signed)
Left message for patient to return call.   Lisa Johnson 03/06/2014 3:09 PM

## 2014-03-07 ENCOUNTER — Telehealth: Payer: Self-pay | Admitting: Obstetrics

## 2014-03-07 NOTE — Telephone Encounter (Signed)
Attempt to call pt but phone unable to LVM. [place] tried to follow up if pt  has an OB appt.   [place]ines C[place]

## 2014-03-13 ENCOUNTER — Other Ambulatory Visit (HOSPITAL_COMMUNITY): Payer: Self-pay

## 2014-03-18 ENCOUNTER — Telehealth (HOSPITAL_COMMUNITY): Payer: Self-pay | Admitting: MS"

## 2014-03-18 NOTE — Telephone Encounter (Signed)
Ms. Lisa Johnson called to inquire about CVS results. Discussed that results are pending. Sample was received by [facility]. I heard from that lab on Friday and molecular testing is in process. Turnaround time is 10 days. Reviewed with patient that timing of results does not necessarily correlate with the result being abnormal or normal. Will call patient when results are available.   Lisa Johnson 03/18/2014 9:25 AM

## 2014-03-19 IMAGING — US US OB LIMITED
1 series · 13 of 28 positions shown · non-contrast
Comparison: none

[Series 1: us ob comp less 14 wks · 13 of 37 slices shown]
[im 2/37]
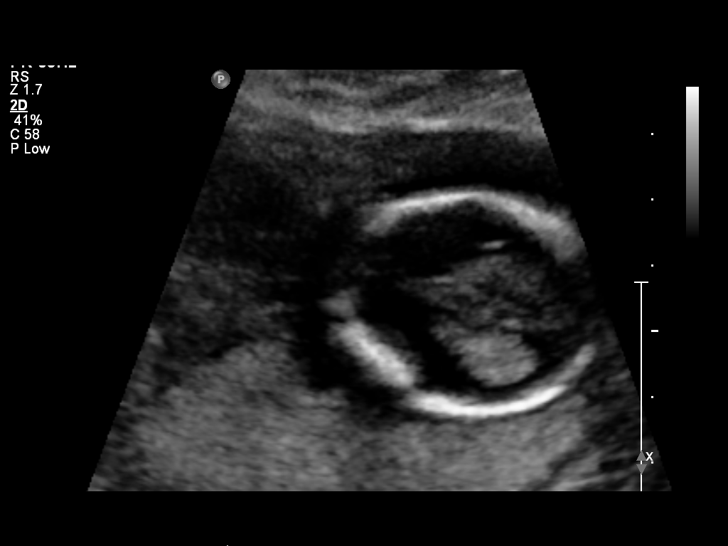
[im 5/37]
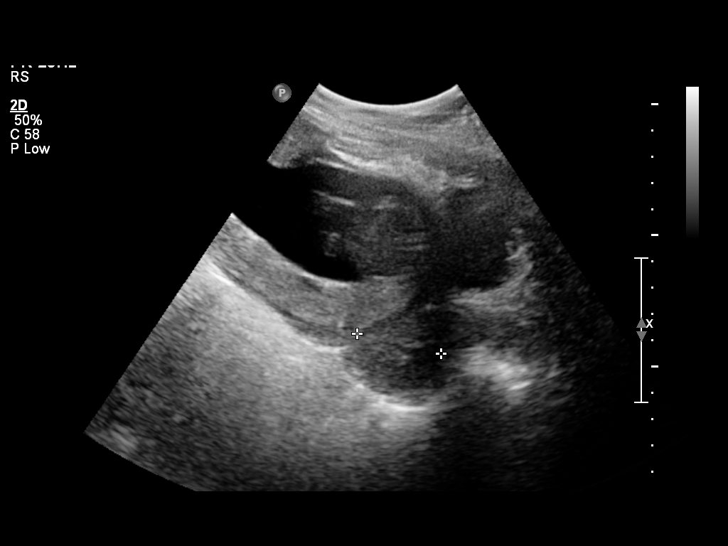
[im 7/37]
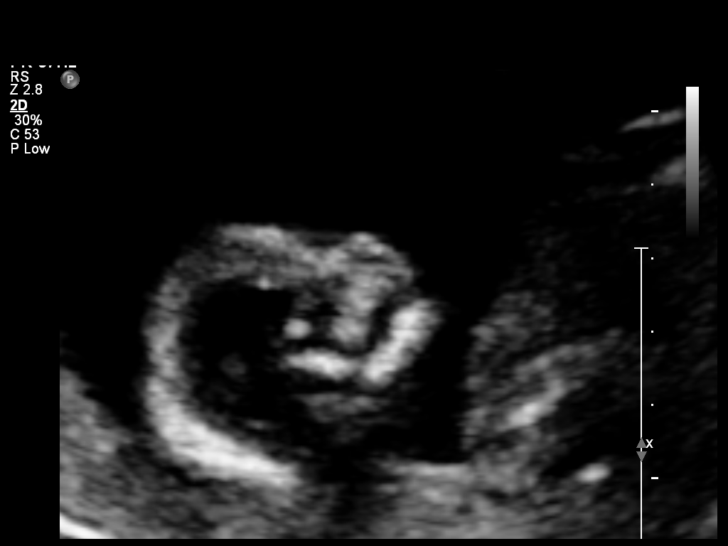
[im 10/37]
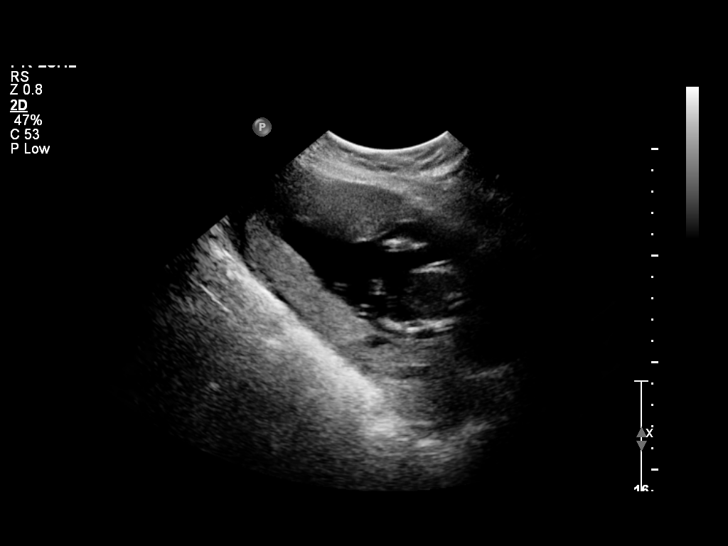
[im 13/37]
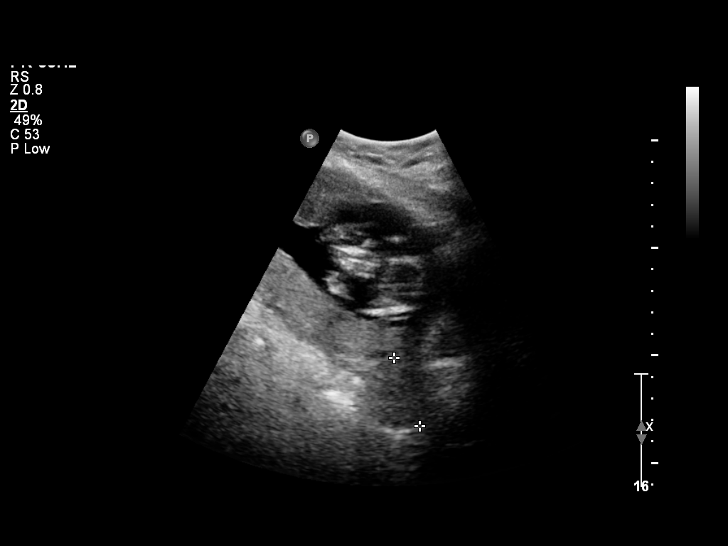
[im 15/37]
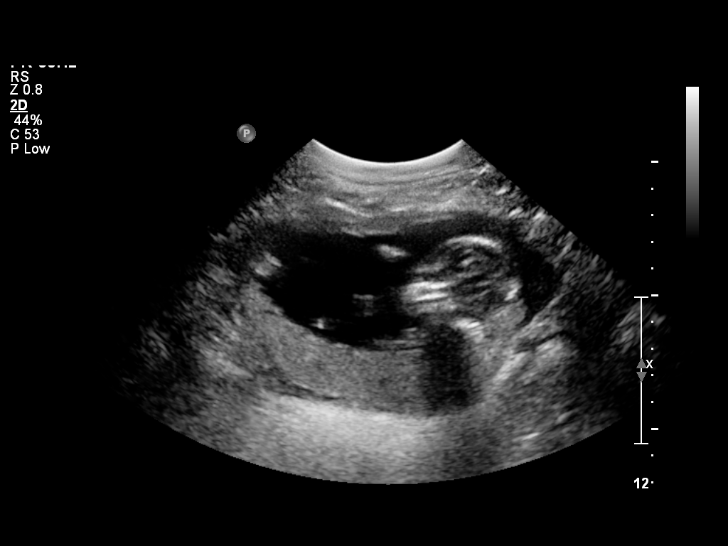
[im 19/37]
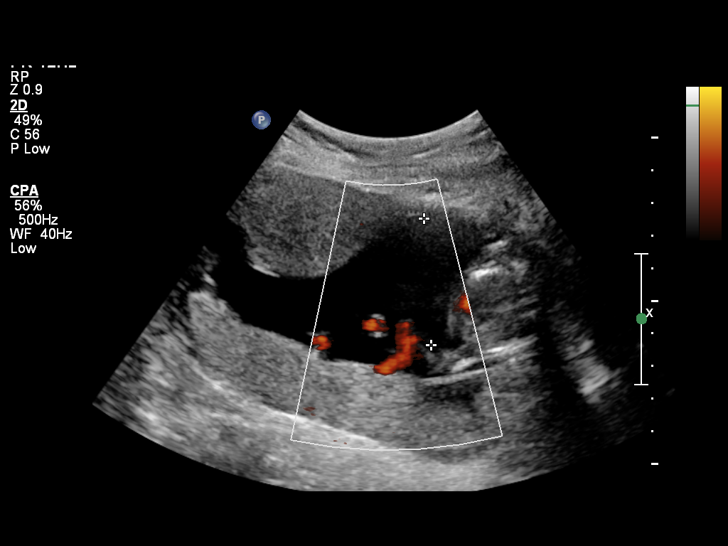
[im 22/37]
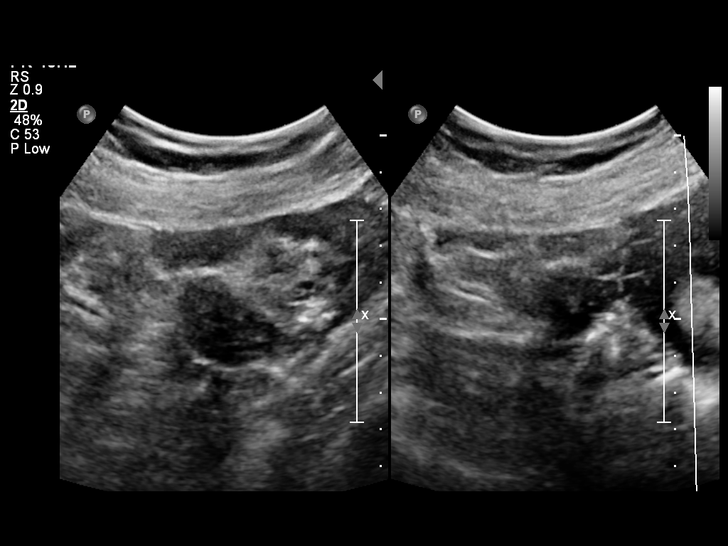
[im 25/37]
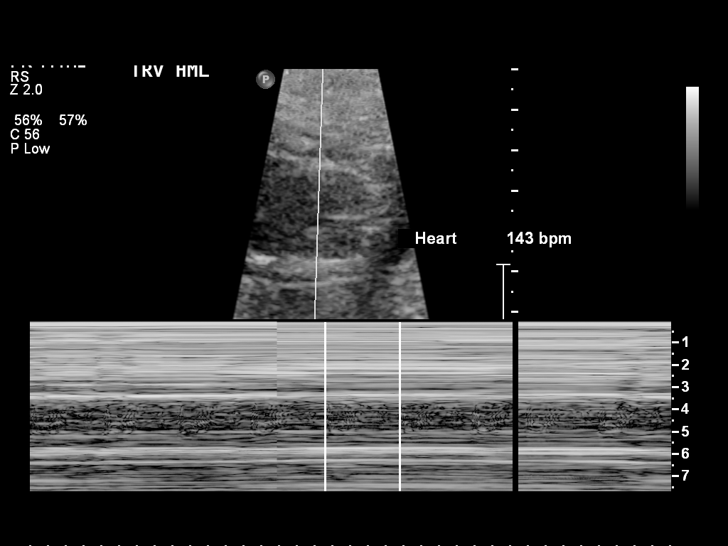
[im 27/37]
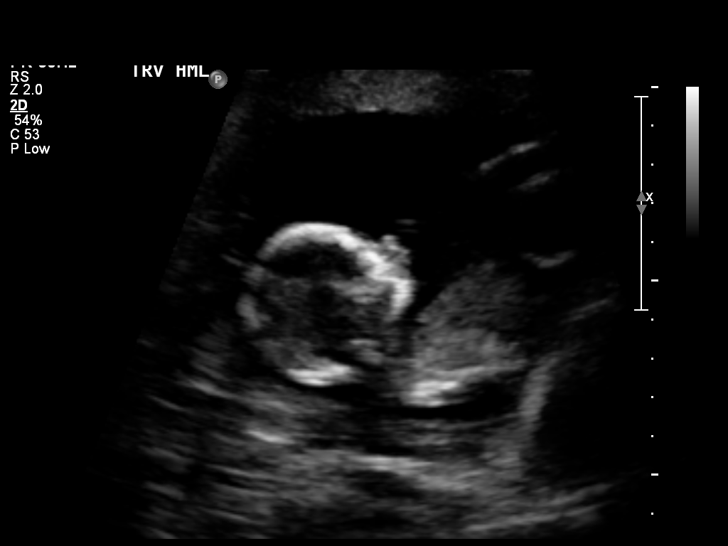
[im 30/37]
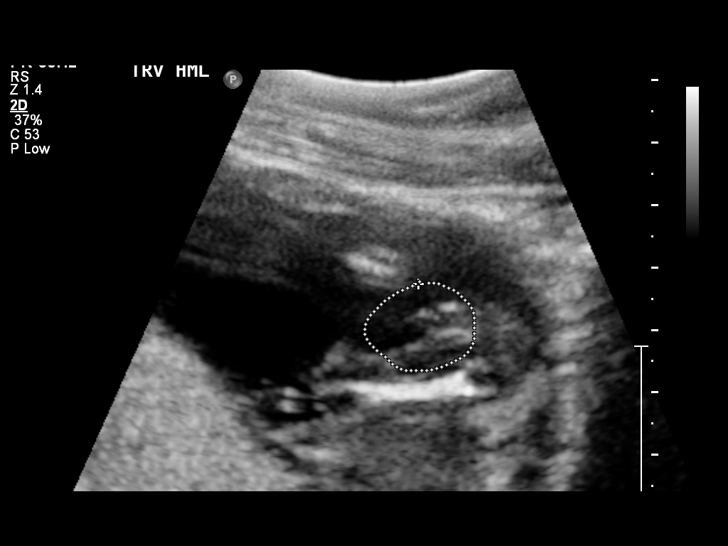
[im 33/37]
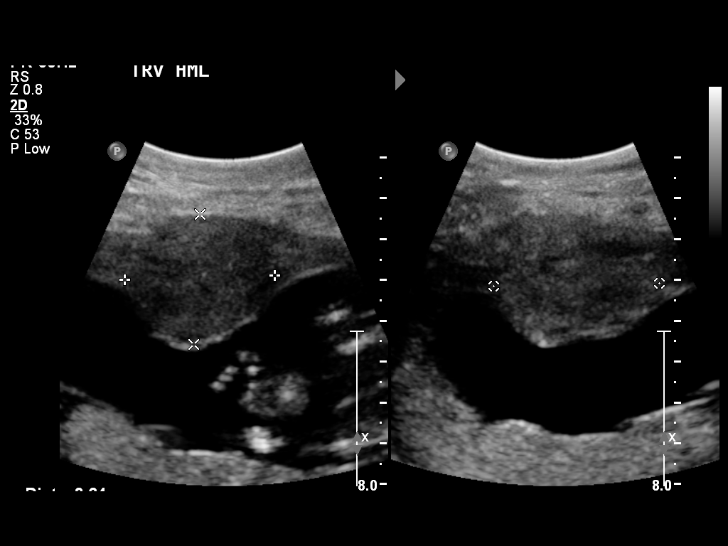
[im 35/37]
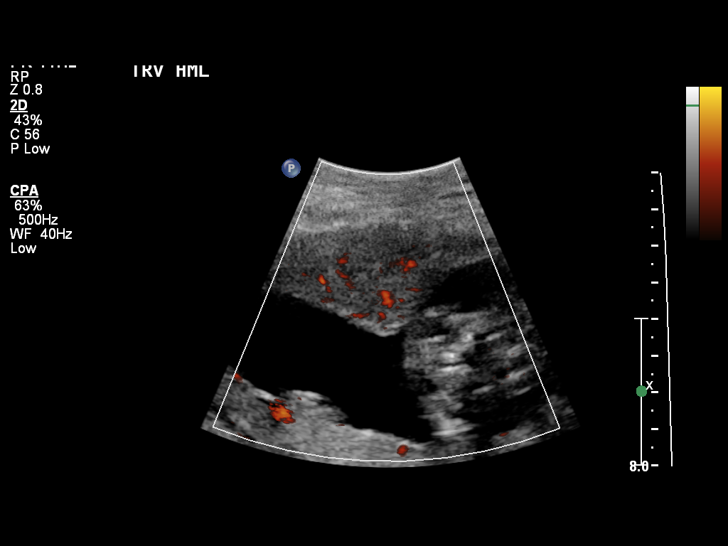

[13 of 28 positions shown; findings below may reference images not displayed]

OBSTETRICS REPORT
                      (Signed Final 09/03/2012 [DATE])

Service(s) Provided

 [HOSPITAL]                                         76815.0
Indications

 Unsure of LMP;  Establish Gestational [AGE]
 Uterine fibroids
Fetal Evaluation

 Num Of Fetuses:    1
 Fetal Heart Rate:  143                         bpm
 Cardiac Activity:  Observed
 Presentation:      Transverse, head to
                    maternal left
 Placenta:          Posterior Previa
 P. Cord            Visualized, central
 Insertion:

 Amniotic Fluid
 AFI FV:      Subjectively within normal limits
                                             Larg Pckt:   3.87   cm
Biometry

 BPD:     33.8  mm    G. Age:   16w 3d
Gestational Age

 LMP:           15w 1d       Date:   05/20/12                 EDD:   02/24/13
 U/S Today:     16w 3d                                        EDD:   02/15/13
 Best:          16w 3d    Det. By:   U/S (09/03/12)           EDD:   02/15/13
Cervix Uterus Adnexa

 Cervical Length:   3.4       cm

 Cervix:       Normal appearance by transabdominal scan.
 Uterus:       ? anterior fibroid vs. focal myometrial contraction
 Cul De Sac:   No free fluid seen.
 Left Ovary:   Within normal limits.
 Right Ovary:  Within normal limits.
 Adnexa:     No abnormality visualized.
Impression

 Single living intrauterine fetus.
 Posterior placenta previa.  No abruption visualized.
 Normal amniotic fluid volume. Normal cervical length.
Recommendations

 Complete OB US necessary for accurate dating.

## 2014-03-25 ENCOUNTER — Telehealth (HOSPITAL_COMMUNITY): Payer: Self-pay | Admitting: MS"

## 2014-03-25 NOTE — Telephone Encounter (Signed)
Called Ms. 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 Reviewed that this testing only assessed for the two causative mutations that were identified in patient's previous affected son and that testing only identified the presence of one of these mutations in the current pregnancy, which is expected to be consistent with carrier status.  All questions were answered to her satisfaction at this time.   [name] Jonta Gastineau 03/25/2014 3:55 PM

## 2014-04-02 ENCOUNTER — Other Ambulatory Visit (HOSPITAL_COMMUNITY): Payer: Self-pay | Admitting: Obstetrics

## 2014-04-02 DIAGNOSIS — E7525 Metachromatic leukodystrophy: Secondary | ICD-10-CM

## 2014-04-02 DIAGNOSIS — Z98891 History of uterine scar from previous surgery: Secondary | ICD-10-CM

## 2014-04-02 DIAGNOSIS — B009 Herpesviral infection, unspecified: Secondary | ICD-10-CM

## 2014-04-02 DIAGNOSIS — O2622 Pregnancy care for patient with recurrent pregnancy loss, second trimester: Secondary | ICD-10-CM

## 2014-04-04 ENCOUNTER — Other Ambulatory Visit (HOSPITAL_COMMUNITY): Payer: Self-pay | Admitting: Obstetrics

## 2014-04-04 ENCOUNTER — Ambulatory Visit (HOSPITAL_COMMUNITY): Payer: Medicaid Other

## 2014-04-04 ENCOUNTER — Ambulatory Visit (HOSPITAL_COMMUNITY)
Admission: RE | Admit: 2014-04-04 | Discharge: 2014-04-04 | Disposition: A | Discharge status: Home / Self Care | Payer: Medicaid Other | Source: Ambulatory Visit | Attending: Obstetrics | Admitting: Obstetrics

## 2014-04-04 ENCOUNTER — Encounter (HOSPITAL_COMMUNITY): Payer: Self-pay

## 2014-04-04 VITALS — BP 119/77 | HR 115 | Wt 184.0 lb

## 2014-04-04 DIAGNOSIS — Z98891 History of uterine scar from previous surgery: Secondary | ICD-10-CM

## 2014-04-04 DIAGNOSIS — Z3A18 18 weeks gestation of pregnancy: Secondary | ICD-10-CM | POA: Diagnosis not present

## 2014-04-04 DIAGNOSIS — O352XX1 Maternal care for (suspected) hereditary disease in fetus, fetus 1: Secondary | ICD-10-CM

## 2014-04-04 DIAGNOSIS — O352XX0 Maternal care for (suspected) hereditary disease in fetus, not applicable or unspecified: Secondary | ICD-10-CM | POA: Insufficient documentation

## 2014-04-04 DIAGNOSIS — O98312 Other infections with a predominantly sexual mode of transmission complicating pregnancy, second trimester: Secondary | ICD-10-CM | POA: Diagnosis not present

## 2014-04-04 DIAGNOSIS — E7525 Metachromatic leukodystrophy: Secondary | ICD-10-CM

## 2014-04-04 DIAGNOSIS — O3421 Maternal care for scar from previous cesarean delivery: Secondary | ICD-10-CM | POA: Insufficient documentation

## 2014-04-04 DIAGNOSIS — O2622 Pregnancy care for patient with recurrent pregnancy loss, second trimester: Secondary | ICD-10-CM

## 2014-04-04 DIAGNOSIS — B009 Herpesviral infection, unspecified: Secondary | ICD-10-CM

## 2014-04-04 DIAGNOSIS — O4402 Placenta previa specified as without hemorrhage, second trimester: Secondary | ICD-10-CM

## 2014-04-04 DIAGNOSIS — O4412 Placenta previa with hemorrhage, second trimester: Secondary | ICD-10-CM | POA: Insufficient documentation

## 2014-04-04 DIAGNOSIS — O352XX4 Maternal care for (suspected) hereditary disease in fetus, fetus 4: Secondary | ICD-10-CM

## 2014-04-04 DIAGNOSIS — A6000 Herpesviral infection of urogenital system, unspecified: Secondary | ICD-10-CM | POA: Diagnosis not present

## 2014-04-07 ENCOUNTER — Encounter (HOSPITAL_COMMUNITY): Payer: Self-pay

## 2014-04-07 ENCOUNTER — Ambulatory Visit (HOSPITAL_COMMUNITY): Payer: Medicaid Other

## 2014-04-07 ENCOUNTER — Telehealth (HOSPITAL_COMMUNITY): Payer: Self-pay | Admitting: MS"

## 2014-04-07 NOTE — Telephone Encounter (Signed)
Patient called and stated that her hands have been going numb and wondered if this was due to what was seen on the ultrasound related to the placenta. Discussed with the patient that this was not my area of expertise, but per discussion with other individuals in our office, the issue with her hands should not likely be related to what was seen regarding the placenta. Instructed patient to call/follow-up with her OB office regarding her concern with numbness in her hands. She asked if I thought she was "going to die." I explained again that I was not very familiar with the specific aspects of concerns regarding the placenta. Based on Dr. Ida Rogue ultrasound report from Friday, the plan is to follow-up in 4 weeks with ultrasound, and based on what is seen at that point, if there are more concerns regarding the placenta, then the patient will likely be referred to a specialist to further discuss the plan for her and the pregnancy.   Lisa Johnson 04/07/2014 10:54 AM

## 2014-04-12 ENCOUNTER — Emergency Department (HOSPITAL_COMMUNITY)
Admission: EM | Admit: 2014-04-12 | Discharge: 2014-04-12 | Discharge status: Against Medical Advice | Payer: Medicaid Other | Attending: Emergency Medicine | Admitting: Emergency Medicine

## 2014-04-12 ENCOUNTER — Encounter (HOSPITAL_COMMUNITY): Payer: Self-pay | Admitting: Emergency Medicine

## 2014-04-12 DIAGNOSIS — R200 Anesthesia of skin: Secondary | ICD-10-CM | POA: Insufficient documentation

## 2014-04-12 DIAGNOSIS — R202 Paresthesia of skin: Secondary | ICD-10-CM | POA: Insufficient documentation

## 2014-04-12 DIAGNOSIS — O26892 Other specified pregnancy related conditions, second trimester: Secondary | ICD-10-CM | POA: Insufficient documentation

## 2014-04-12 DIAGNOSIS — Z3A19 19 weeks gestation of pregnancy: Secondary | ICD-10-CM | POA: Insufficient documentation

## 2014-04-12 DIAGNOSIS — Z79899 Other long term (current) drug therapy: Secondary | ICD-10-CM | POA: Diagnosis not present

## 2014-04-12 DIAGNOSIS — Z87891 Personal history of nicotine dependence: Secondary | ICD-10-CM | POA: Insufficient documentation

## 2014-04-12 MED ORDER — METHYLPREDNISOLONE SODIUM SUCC 125 MG IJ SOLR
INTRAMUSCULAR | Status: AC
  Filled 2014-04-12: qty 2

## 2014-04-12 MED ORDER — SODIUM CHLORIDE 0.9 % IV BOLUS (SEPSIS): 1000.0000 mL | Freq: Once | INTRAVENOUS | Status: DC

## 2014-04-12 MED ORDER — DIPHENHYDRAMINE HCL 50 MG/ML IJ SOLN
INTRAMUSCULAR | Status: AC
  Filled 2014-04-12: qty 1

## 2014-04-12 MED ORDER — EPINEPHRINE 0.3 MG/0.3ML IJ SOAJ
INTRAMUSCULAR | Status: AC
  Filled 2014-04-12: qty 0.3

## 2014-04-12 NOTE — ED Notes (Signed)
Call for pt who is 19weeks with numbness to side. Recommended that RN doppler FHR.

## 2014-04-12 NOTE — ED Notes (Signed)
Pt c/o numbness/tingling to hands/arms when lying on opposite side. Pt states when she is lying flat both arms feel numb x 1 month. Pt is 41 W preg, has reported this to OB. Pt does have complete placenta previa per U/S 04/04/14. Pt also reports decreased fetal movement, denies vaginal bleeding. Pt states she is concerned baby is having a seizure d/t her numbness.

## 2014-04-12 NOTE — ED Notes (Addendum)
Patient stated that she had to go home due to her kids being at home by their selves and nobody is home with her kids. Patient left without being discharged.

## 2014-04-12 NOTE — ED Provider Notes (Signed)
CSN: [id number]     Arrival date & time 04/12/14  0145 History   First MD Initiated Contact with Patient 04/12/14 [phone]     Chief Complaint  Patient presents with  . Numbness     (Consider location/radiation/quality/duration/timing/severity/associated sxs/prior Treatment) HPI  Sabine L Riebe is a 33 y.o. female G5 P4 coming in with numbness. Patient states she is [redacted] weeks pregnant. She states for the past month she's had numbness when she wakes up morning. If she lays on her right side she has left upper extremity numbness. She lays on her left side she has right upper extremity numbness. If she lays on her back she has bilateral upper extremity numbness. She states she has to shake her extremities to resolve the symptoms every morning. She denies this occurring with her previous pregnancies. She has gotten prenatal care including ultrasound that revealed a placenta previa and she is scheduled to have a C-section. She denies chest pain, shortness of breath, or any neurological symptoms.  Patient has no further complaints.  10 Systems reviewed and are negative for acute change except as noted in the HPI.     History reviewed. No pertinent past medical history. Past Surgical History  Procedure Laterality Date  . Cesarean section     Family History  Problem Relation Age of Onset  . Other Son 2    metochromatic leukodystrophy  . SIDS Daughter    History  Substance Use Topics  . Smoking status: Former Smoker    Quit date: 10/04/2013  . Smokeless tobacco: Not on file  . 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  Home Medications   Prior to Admission medications   Medication Sig Start Date End Date Taking? Authorizing Provider   Prenatal Vit-Fe Fumarate-FA (MULTIVITAMIN-PRENATAL) 27-0.8 MG TABS tablet Take 1 tablet by mouth daily at 12 noon.   Yes Historical Provider, MD  ibuprofen (ADVIL,MOTRIN) 600 MG tablet Take 1 tablet (600 mg total) by mouth every 6 (six) hours as needed. 08/03/13   Threasa Beards, MD  oxyCODONE-acetaminophen (PERCOCET/ROXICET) 5-325 MG per tablet Take 1-2 tablets by mouth every 6 (six) hours as needed for severe pain. 08/03/13   Threasa Beards, MD  PRESCRIPTION MEDICATION Birth control 20 day    Historical Provider, MD  valACYclovir (VALTREX) 500 MG tablet Take 500 mg by mouth daily.    Historical Provider, MD   BP 102/55 mmHg  Pulse 88  Temp(Src) 97.8 F (36.6 C) (Oral)  Resp 18  Ht 5\' 4"  (1.626 m)  Wt 180 lb (81.647 kg)  BMI 30.88 kg/m2  SpO2 100% Physical Exam  Constitutional: She is oriented to person, place, and time. She appears well-developed and well-nourished. No distress.  HENT:  Head: Normocephalic and atraumatic.  Nose: Nose normal.  Mouth/Throat: Oropharynx is clear and moist. No oropharyngeal exudate.  Eyes: Conjunctivae and EOM are normal. Pupils are equal, round, and reactive to light. No scleral icterus.  Neck: Normal range of motion. Neck supple. No JVD present. No tracheal deviation present. No thyromegaly present.  Cardiovascular: Normal rate, regular rhythm and normal heart sounds.  Exam reveals no gallop and no  friction rub.   No murmur heard. Pulmonary/Chest: Effort normal and breath sounds normal. No respiratory distress. She has no wheezes. She exhibits no tenderness.  Abdominal: Soft. Bowel sounds are normal. She exhibits no distension and no mass. There is no tenderness. There is no rebound and no guarding.  Gravid uterus  Musculoskeletal: Normal range of motion. She exhibits no edema or tenderness.  Lymphadenopathy:    She has no cervical adenopathy.  Neurological: She is alert and oriented to person, place, and time. No cranial nerve deficit. She exhibits  normal muscle tone.  5 out of 5 strength times first ovaries, normal sensation 4 extremities, normal cerebellar testing.  Skin: Skin is warm and dry. No rash noted. She is not diaphoretic. No erythema. No pallor.  Nursing note and vitals reviewed.   ED Course  Procedures (including critical care time) Labs Review Labs Reviewed - No data to display  Imaging Review No results found.   EKG Interpretation None      MDM   Final diagnoses:  None    Patient does emergency department for numbness. I do not believe the patient is having acute neurological emergency. Her neurological exam here is normal. This is likely due to the way she is sleeping causing some sort of compression. We'll obtain laboratory studies, patient has follow-up on Tuesday with her OB/GYN.    I went to reassess patient and perform bedside US of fetus.  Patient was not found in room.  Upon questioning nursing staff, they informed me that she eloped prior to receiving DC instructions.  Patient work up had not yet begun and bloodwork was not obtained.  I was able to encourage her to fu with OB on Tuesday during my initial encounter.     Everlene Balls, MD 04/12/14 1343

## 2014-05-02 ENCOUNTER — Encounter (HOSPITAL_COMMUNITY): Payer: Self-pay

## 2014-05-02 ENCOUNTER — Ambulatory Visit (HOSPITAL_COMMUNITY)
Admission: RE | Admit: 2014-05-02 | Discharge: 2014-05-02 | Disposition: A | Discharge status: Home / Self Care | Payer: Medicaid Other | Source: Ambulatory Visit | Attending: Obstetrics | Admitting: Obstetrics

## 2014-05-02 DIAGNOSIS — O352XX4 Maternal care for (suspected) hereditary disease in fetus, fetus 4: Secondary | ICD-10-CM

## 2014-05-02 DIAGNOSIS — Z98891 History of uterine scar from previous surgery: Secondary | ICD-10-CM | POA: Insufficient documentation

## 2014-05-02 DIAGNOSIS — O3421 Maternal care for scar from previous cesarean delivery: Secondary | ICD-10-CM | POA: Insufficient documentation

## 2014-05-02 DIAGNOSIS — O4402 Placenta previa specified as without hemorrhage, second trimester: Secondary | ICD-10-CM

## 2014-05-02 DIAGNOSIS — O352XX0 Maternal care for (suspected) hereditary disease in fetus, not applicable or unspecified: Secondary | ICD-10-CM | POA: Diagnosis not present

## 2014-05-02 DIAGNOSIS — B009 Herpesviral infection, unspecified: Secondary | ICD-10-CM | POA: Diagnosis not present

## 2014-05-02 DIAGNOSIS — O2622 Pregnancy care for patient with recurrent pregnancy loss, second trimester: Secondary | ICD-10-CM

## 2014-05-02 DIAGNOSIS — Z3A22 22 weeks gestation of pregnancy: Secondary | ICD-10-CM | POA: Diagnosis not present

## 2014-05-02 DIAGNOSIS — E7525 Metachromatic leukodystrophy: Secondary | ICD-10-CM

## 2014-05-02 DIAGNOSIS — O98519 Other viral diseases complicating pregnancy, unspecified trimester: Secondary | ICD-10-CM | POA: Diagnosis present

## 2014-05-02 HISTORY — DX: Other specified health status: Z78.9

## 2014-06-18 ENCOUNTER — Emergency Department (HOSPITAL_COMMUNITY)
Admission: EM | Admit: 2014-06-18 | Discharge: 2014-06-18 | Disposition: A | Discharge status: Other Acute Inpatient Hospital | Payer: No Typology Code available for payment source | Attending: Emergency Medicine | Admitting: Emergency Medicine

## 2014-06-18 ENCOUNTER — Emergency Department (HOSPITAL_COMMUNITY): Payer: No Typology Code available for payment source

## 2014-06-18 ENCOUNTER — Encounter (HOSPITAL_COMMUNITY): Payer: Self-pay

## 2014-06-18 DIAGNOSIS — O4693 Antepartum hemorrhage, unspecified, third trimester: Secondary | ICD-10-CM

## 2014-06-18 DIAGNOSIS — Z8619 Personal history of other infectious and parasitic diseases: Secondary | ICD-10-CM | POA: Insufficient documentation

## 2014-06-18 DIAGNOSIS — O468X3 Other antepartum hemorrhage, third trimester: Secondary | ICD-10-CM | POA: Insufficient documentation

## 2014-06-18 DIAGNOSIS — R61 Generalized hyperhidrosis: Secondary | ICD-10-CM | POA: Diagnosis not present

## 2014-06-18 DIAGNOSIS — Z3A29 29 weeks gestation of pregnancy: Secondary | ICD-10-CM | POA: Insufficient documentation

## 2014-06-18 DIAGNOSIS — Z79899 Other long term (current) drug therapy: Secondary | ICD-10-CM | POA: Diagnosis not present

## 2014-06-18 DIAGNOSIS — S79912A Unspecified injury of left hip, initial encounter: Secondary | ICD-10-CM | POA: Insufficient documentation

## 2014-06-18 DIAGNOSIS — Y92410 Unspecified street and highway as the place of occurrence of the external cause: Secondary | ICD-10-CM | POA: Insufficient documentation

## 2014-06-18 DIAGNOSIS — O99343 Other mental disorders complicating pregnancy, third trimester: Secondary | ICD-10-CM | POA: Insufficient documentation

## 2014-06-18 DIAGNOSIS — Y998 Other external cause status: Secondary | ICD-10-CM | POA: Insufficient documentation

## 2014-06-18 DIAGNOSIS — Z87891 Personal history of nicotine dependence: Secondary | ICD-10-CM | POA: Diagnosis not present

## 2014-06-18 DIAGNOSIS — Y9389 Activity, other specified: Secondary | ICD-10-CM | POA: Diagnosis not present

## 2014-06-18 DIAGNOSIS — F419 Anxiety disorder, unspecified: Secondary | ICD-10-CM | POA: Diagnosis not present

## 2014-06-18 DIAGNOSIS — O9A213 Injury, poisoning and certain other consequences of external causes complicating pregnancy, third trimester: Secondary | ICD-10-CM | POA: Diagnosis present

## 2014-06-18 HISTORY — DX: Herpesviral infection, unspecified: B00.9

## 2014-06-18 LAB — COMPREHENSIVE METABOLIC PANEL
ALBUMIN: 3.3 g/dL — AB (ref 3.5–5.2)
ALK PHOS: 69 U/L (ref 39–117)
ALT: 37 U/L — ABNORMAL HIGH (ref 0–35)
AST: 30 U/L (ref 0–37)
Anion gap: 9 (ref 5–15)
BUN: 7 mg/dL (ref 6–23)
CHLORIDE: 100 meq/L (ref 96–112)
CO2: 21 mmol/L (ref 19–32)
Calcium: 8.9 mg/dL (ref 8.4–10.5)
Creatinine, Ser: 0.59 mg/dL (ref 0.50–1.10)
GFR calc Af Amer: >90 mL/min (ref >90)
GFR calc non Af Amer: >90 mL/min (ref >90)
Glucose, Bld: 95 mg/dL (ref 70–99)
Potassium: 3.7 mmol/L (ref 3.5–5.1)
Sodium: 130 mmol/L — ABNORMAL LOW (ref 135–145)
TOTAL PROTEIN: 6.6 g/dL (ref 6.0–8.3)
Total Bilirubin: 0.5 mg/dL (ref 0.3–1.2)

## 2014-06-18 LAB — SAMPLE TO BLOOD BANK

## 2014-06-18 LAB — CBC
HEMATOCRIT: 33.6 % — AB (ref 36.0–46.0)
HEMOGLOBIN: 11.4 g/dL — AB (ref 12.0–15.0)
MCH: 32.4 pg (ref 26.0–34.0)
MCHC: 33.9 g/dL (ref 30.0–36.0)
MCV: 95.5 fL (ref 78.0–100.0)
Platelets: 237 10*3/uL (ref 150–400)
RBC: 3.52 MIL/uL — ABNORMAL LOW (ref 3.87–5.11)
RDW: 13 % (ref 11.5–15.5)
WBC: 8.9 10*3/uL (ref 4.0–10.5)

## 2014-06-18 LAB — ETHANOL: Alcohol, Ethyl (B): <5 mg/dL (ref 0–9)

## 2014-06-18 LAB — PROTIME-INR
INR: 0.96 (ref 0.00–1.49)
Prothrombin Time: 12.9 seconds (ref 11.6–15.2)

## 2014-06-18 MED ORDER — SODIUM CHLORIDE 0.9 % IV SOLN
1000.0000 mL | INTRAVENOUS | Status: DC
  Administered 2014-06-18: 1000 mL via INTRAVENOUS

## 2014-06-18 MED ORDER — SODIUM CHLORIDE 0.9 % IV SOLN
INTRAVENOUS | Status: AC | PRN
  Administered 2014-06-18: 1000 mL via INTRAVENOUS

## 2014-06-18 MED ORDER — SODIUM CHLORIDE 0.9 % IV SOLN
1000.0000 mL | Freq: Once | INTRAVENOUS | Status: AC
  Administered 2014-06-18: 1000 mL via INTRAVENOUS

## 2014-06-18 NOTE — ED Notes (Signed)
Transport team at bedside
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
## 2014-06-19 LAB — CDS SEROLOGY

## 2014-06-26 ENCOUNTER — Other Ambulatory Visit (HOSPITAL_COMMUNITY): Payer: Self-pay | Admitting: Obstetrics and Gynecology

## 2014-06-26 DIAGNOSIS — O43213 Placenta accreta, third trimester: Secondary | ICD-10-CM

## 2014-07-10 ENCOUNTER — Ambulatory Visit (HOSPITAL_COMMUNITY): Payer: Medicaid Other | Attending: Obstetrics and Gynecology

## 2014-07-24 ENCOUNTER — Ambulatory Visit (HOSPITAL_COMMUNITY)
Admission: RE | Admit: 2014-07-24 | Discharge: 2014-07-24 | Disposition: A | Discharge status: Home / Self Care | Payer: Medicaid Other | Source: Ambulatory Visit | Attending: Obstetrics and Gynecology | Admitting: Obstetrics and Gynecology

## 2014-07-24 DIAGNOSIS — O352XX0 Maternal care for (suspected) hereditary disease in fetus, not applicable or unspecified: Secondary | ICD-10-CM | POA: Insufficient documentation

## 2014-07-24 DIAGNOSIS — Z3A00 Weeks of gestation of pregnancy not specified: Secondary | ICD-10-CM | POA: Diagnosis not present

## 2014-07-24 MED ORDER — BETAMETHASONE SOD PHOS & ACET 6 (3-3) MG/ML IJ SUSP
12.0000 mg | Freq: Once | INTRAMUSCULAR | Status: AC
  Administered 2014-07-24: 12 mg via INTRAMUSCULAR
  Filled 2014-07-24: qty 2

## 2014-07-25 ENCOUNTER — Ambulatory Visit (HOSPITAL_COMMUNITY)
Admission: RE | Admit: 2014-07-25 | Discharge: 2014-07-25 | Disposition: A | Discharge status: Home / Self Care | Payer: Medicaid Other | Source: Ambulatory Visit | Attending: Obstetrics and Gynecology | Admitting: Obstetrics and Gynecology

## 2014-07-25 DIAGNOSIS — Z3A00 Weeks of gestation of pregnancy not specified: Secondary | ICD-10-CM | POA: Insufficient documentation

## 2014-07-25 DIAGNOSIS — O352XX0 Maternal care for (suspected) hereditary disease in fetus, not applicable or unspecified: Secondary | ICD-10-CM | POA: Diagnosis present

## 2014-07-25 DIAGNOSIS — O352XX4 Maternal care for (suspected) hereditary disease in fetus, fetus 4: Secondary | ICD-10-CM

## 2014-07-25 MED ORDER — BETAMETHASONE SOD PHOS & ACET 6 (3-3) MG/ML IJ SUSP
12.0000 mg | Freq: Once | INTRAMUSCULAR | Status: AC
  Administered 2014-07-25: 12 mg via INTRAMUSCULAR
  Filled 2014-07-25: qty 2

## 2014-12-12 ENCOUNTER — Encounter (HOSPITAL_COMMUNITY): Payer: Self-pay | Admitting: MS"

## 2014-12-12 ENCOUNTER — Telehealth (HOSPITAL_COMMUNITY): Payer: Self-pay | Admitting: MS"

## 2014-12-12 NOTE — Telephone Encounter (Signed)
Patient called and wanted to hear again that [facility] testing results from recent pregnancy were normal. 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 She remembers that the testing was normal but stated that she had concerns about her now 14 month old choking on her spit so wanted to hear again what the test results were. Patient identified by name and DOB. Reviewed with Ms. Zentz that the molecular testing was performed for the two previous gene mutations identified in her son, and that the testing from [facility] identified one mutation but not the other. Thus, her daughter was reported to be a carrier of one mutation but not have both. Also reviewed that chromosome testing was normal from the [facility]. Encouraged Ms. Haggar to contact her pediatrician with her questions/concerns about her baby. She stated that her daughter has a pediatrician visit scheduled for Monday.   [name] Kanasia Gayman 12/12/2014 11:52 AM
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
## 2016-03-02 ENCOUNTER — Encounter: Payer: Self-pay | Admitting: *Deleted

## 2016-10-26 ENCOUNTER — Ambulatory Visit: Payer: Medicaid Other | Admitting: Obstetrics and Gynecology

## 2017-03-15 ENCOUNTER — Encounter: Payer: Self-pay | Admitting: Obstetrics and Gynecology

## 2017-03-16 ENCOUNTER — Ambulatory Visit: Payer: Self-pay | Admitting: Obstetrics and Gynecology

## 2017-04-04 ENCOUNTER — Ambulatory Visit: Payer: Medicaid Other | Admitting: Obstetrics

## 2017-07-27 ENCOUNTER — Emergency Department (HOSPITAL_COMMUNITY): Payer: Medicaid Other

## 2017-07-27 ENCOUNTER — Encounter (HOSPITAL_COMMUNITY): Payer: Self-pay | Admitting: Emergency Medicine

## 2017-07-27 ENCOUNTER — Emergency Department (HOSPITAL_COMMUNITY)
Admission: EM | Admit: 2017-07-27 | Discharge: 2017-07-27 | Disposition: A | Discharge status: Home / Self Care | Payer: Medicaid Other | Attending: Emergency Medicine | Admitting: Emergency Medicine

## 2017-07-27 DIAGNOSIS — Z87891 Personal history of nicotine dependence: Secondary | ICD-10-CM | POA: Diagnosis not present

## 2017-07-27 DIAGNOSIS — R109 Unspecified abdominal pain: Secondary | ICD-10-CM

## 2017-07-27 DIAGNOSIS — R1031 Right lower quadrant pain: Secondary | ICD-10-CM | POA: Insufficient documentation

## 2017-07-27 DIAGNOSIS — Z79899 Other long term (current) drug therapy: Secondary | ICD-10-CM | POA: Diagnosis not present

## 2017-07-27 LAB — COMPREHENSIVE METABOLIC PANEL
ALT: 29 U/L (ref 14–54)
AST: 40 U/L (ref 15–41)
Albumin: 4.3 g/dL (ref 3.5–5.0)
Alkaline Phosphatase: 75 U/L (ref 38–126)
Anion gap: 7 (ref 5–15)
BILIRUBIN TOTAL: 1 mg/dL (ref 0.3–1.2)
BUN: 14 mg/dL (ref 6–20)
CO2: 23 mmol/L (ref 22–32)
Calcium: 8.4 mg/dL — ABNORMAL LOW (ref 8.9–10.3)
Chloride: 108 mmol/L (ref 101–111)
Creatinine, Ser: 0.67 mg/dL (ref 0.44–1.00)
GFR calc Af Amer: >60 mL/min (ref >60)
GFR calc non Af Amer: >60 mL/min (ref >60)
GLUCOSE: 93 mg/dL (ref 65–99)
POTASSIUM: 3.5 mmol/L (ref 3.5–5.1)
Sodium: 138 mmol/L (ref 135–145)
Total Protein: 7.4 g/dL (ref 6.5–8.1)

## 2017-07-27 LAB — CBC WITH DIFFERENTIAL/PLATELET
Basophils Absolute: 0 10*3/uL (ref 0.0–0.1)
Basophils Relative: 0 %
Eosinophils Absolute: 0.1 10*3/uL (ref 0.0–0.7)
Eosinophils Relative: 2 %
HCT: 40.8 % (ref 36.0–46.0)
Hemoglobin: 13.9 g/dL (ref 12.0–15.0)
LYMPHS ABS: 1 10*3/uL (ref 0.7–4.0)
Lymphocytes Relative: 22 %
MCH: 32.9 pg (ref 26.0–34.0)
MCHC: 34.1 g/dL (ref 30.0–36.0)
MCV: 96.7 fL (ref 78.0–100.0)
Monocytes Absolute: 0.3 10*3/uL (ref 0.1–1.0)
Monocytes Relative: 7 %
NEUTROS PCT: 69 %
Neutro Abs: 3 10*3/uL (ref 1.7–7.7)
PLATELETS: 230 10*3/uL (ref 150–400)
RBC: 4.22 MIL/uL (ref 3.87–5.11)
RDW: 13.2 % (ref 11.5–15.5)
WBC: 4.4 10*3/uL (ref 4.0–10.5)

## 2017-07-27 LAB — URINALYSIS, ROUTINE W REFLEX MICROSCOPIC
Bilirubin Urine: NEGATIVE
GLUCOSE, UA: NEGATIVE mg/dL
Hgb urine dipstick: NEGATIVE
Ketones, ur: 5 mg/dL — AB
LEUKOCYTES UA: NEGATIVE
Nitrite: NEGATIVE
PROTEIN: NEGATIVE mg/dL
SPECIFIC GRAVITY, URINE: 1.013 (ref 1.005–1.030)
pH: 6 (ref 5.0–8.0)

## 2017-07-27 LAB — I-STAT BETA HCG BLOOD, ED (MC, WL, AP ONLY)

## 2017-07-27 NOTE — ED Notes (Signed)
Patient asking if she can have juice, made PA aware. PA wants to wait until all her tests have resulted first.

## 2017-07-27 NOTE — ED Notes (Signed)
Patient transported to Ultrasound 

## 2017-07-27 NOTE — ED Triage Notes (Signed)
Per pt, states right lower quadrant pain for a few days-no N/V/D

## 2017-07-27 NOTE — ED Provider Notes (Signed)
[facility] DEPT Provider Note   CSN: [id number] Arrival date & time: 07/27/17  0904     History   Chief Complaint Chief Complaint  Patient presents with  . Abdominal Pain    HPI Lisa Johnson is a 37 y.o. 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 Pain lasts for about 1 day at a time. Denies vaginal bleeding, vaginal discharge, dysuria, fever, bowel changes. She does have a history of alcohol abuse that began approximately 6 years ago after her 53-year-old son passed away.  She states that her alcohol use has decreased since then but does report taking "1 or 2 shots" of liquor daily.  She also reports daily marijuana use.  Denies any other drug use.  HPI  Past Medical History:  Diagnosis Date  . Herpes   . Medical history non-contributory     Patient Active Problem List   Diagnosis Date Noted  . Previous cesarean section   . Placenta previa antepartum in second trimester   . [redacted] weeks gestation of pregnancy     Past Surgical History:  Procedure Laterality Date  . ABDOMINAL HYSTERECTOMY    . 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 Authorizing Provider  valACYclovir (VALTREX) 500 MG tablet Take 500 mg by mouth daily.   Yes [provider]  ibuprofen (ADVIL,MOTRIN) 600 MG tablet Take 1 tablet (600 mg total) by mouth every 6 (six) hours as needed. Patient not taking: Reported on 06/18/2014 08/03/13   Pixie Casino, MD  oxyCODONE-acetaminophen (PERCOCET/ROXICET) 5-325 MG per tablet Take 1-2 tablets by  mouth every 6 (six) hours as needed for severe pain. Patient not taking: Reported on 06/18/2014 08/03/13   Pixie Casino, MD    Family History Family History  Problem Relation Age of Onset  . Other Son 2       metochromatic leukodystrophy  . SIDS Daughter     Social History Social History   Tobacco Use  . Smoking status: Former Smoker    Last attempt to quit: 10/04/2013    Years since quitting: 3.8  Substance Use Topics  . Alcohol use: No  . Drug use: No    Comment: not since being pregnant     Allergies   Patient has no known allergies.   Review of Systems Review of Systems  Constitutional: Negative for appetite change, chills and fever.  HENT: Negative for ear pain, rhinorrhea, sneezing and sore throat.   Eyes: Negative for photophobia and visual disturbance.  Respiratory: Negative for cough, chest tightness, shortness of breath and wheezing.   Cardiovascular: Negative for chest pain and palpitations.  Gastrointestinal: Positive for abdominal pain. Negative for blood in stool, constipation, diarrhea, nausea and vomiting.  Genitourinary: Negative for dysuria, hematuria, urgency, vaginal bleeding and vaginal discharge.  Musculoskeletal: Negative for myalgias.  Skin: Negative for rash.  Neurological: Negative for dizziness, weakness and light-headedness.     Physical  Exam Updated Vital Signs BP 107/62 (BP Location: Left Arm)   Pulse 62   Temp 98.5 F (36.9 C) (Oral)   Resp 16   LMP 07/29/2013   SpO2 98%   Physical Exam  Constitutional: She appears well-developed and well-nourished. No distress.  Nontoxic appearing and in no acute distress.  Appears comfortable.  HENT:  Head: Normocephalic and atraumatic.  Nose: Nose normal.  Eyes: Conjunctivae and EOM are normal. Left eye exhibits no discharge. No scleral icterus.  Neck: Normal range of motion. Neck supple.  Cardiovascular: Normal rate, regular rhythm, normal heart sounds and intact distal pulses. Exam  reveals no gallop and no friction rub.  No murmur heard. Pulmonary/Chest: Effort normal and breath sounds normal. No respiratory distress.  Abdominal: Soft. Bowel sounds are normal. She exhibits no distension. There is tenderness in the right lower quadrant. There is no rigidity, no rebound and no guarding.  Musculoskeletal: Normal range of motion. She exhibits no edema.  Neurological: She is alert. She exhibits normal muscle tone. Coordination normal.  Skin: Skin is warm and dry. No rash noted.  Psychiatric: She has a normal mood and affect.  Nursing note and vitals reviewed.    ED Treatments / Results  Labs (all labs ordered are listed, but only abnormal results are displayed) Labs Reviewed  COMPREHENSIVE METABOLIC PANEL - Abnormal; Notable for the following components:      Result Value   Calcium 8.4 (*)    All other components within normal limits  URINALYSIS, ROUTINE W REFLEX MICROSCOPIC - Abnormal; Notable for the following components:   Ketones, ur 5 (*)    All other components within normal limits  CBC WITH DIFFERENTIAL/PLATELET  I-STAT BETA HCG BLOOD, ED ([facility], [facility], [facility] ONLY)    EKG  EKG Interpretation None       Radiology US Transvaginal Non-ob  Result Date: 07/27/2017 CLINICAL DATA:  Right lower quadrant pain, intermittent for a year EXAM: TRANSABDOMINAL AND TRANSVAGINAL ULTRASOUND OF PELVIS DOPPLER ULTRASOUND OF OVARIES TECHNIQUE: Both transabdominal and transvaginal ultrasound examinations of the pelvis were performed. Transabdominal technique was performed for global imaging of the pelvis including uterus, ovaries, adnexal regions, and pelvic cul-de-sac. It was necessary to proceed with endovaginal exam following the transabdominal exam to visualize the ovaries/adnexa. Color and duplex Doppler ultrasound was utilized to evaluate blood flow to the ovaries. COMPARISON:  None FINDINGS: Uterus Measurements: Prior hysterectomy. Endometrium Thickness: N/A. Right ovary  Measurements: Unable to visualize due to bowel gas. No adnexal mass seen. Left ovary Measurements: 3.4 x 1.6 x 2.3 cm. Multiple small follicles. Normal appearance/no adnexal mass. Pulsed Doppler evaluation demonstrates normal low-resistance arterial and venous waveforms within the left ovary. Other findings Trace free fluid in the pelvis IMPRESSION: Prior hysterectomy. Small follicles in the left ovary. No torsion. Right ovary not visualized. No adnexal mass seen. Electronically Signed   By: Rolm Baptise M.D.   On: 07/27/2017 11:53   US Pelvis Complete  Result Date: 07/27/2017 CLINICAL DATA:  Right lower quadrant pain, intermittent for a year EXAM: TRANSABDOMINAL AND TRANSVAGINAL ULTRASOUND OF PELVIS DOPPLER ULTRASOUND OF OVARIES TECHNIQUE: Both transabdominal and transvaginal ultrasound examinations of the pelvis were performed. Transabdominal technique was performed for global imaging of the pelvis including uterus, ovaries, adnexal regions, and pelvic cul-de-sac. It was necessary to proceed with endovaginal exam following the transabdominal exam to visualize the ovaries/adnexa. Color and duplex Doppler ultrasound was utilized to evaluate blood flow to the ovaries. COMPARISON:  None FINDINGS: Uterus Measurements: Prior hysterectomy. Endometrium  Thickness: N/A. Right ovary Measurements: Unable to visualize due to bowel gas. No adnexal mass seen. Left ovary Measurements: 3.4 x 1.6 x 2.3 cm. Multiple small follicles. Normal appearance/no adnexal mass. Pulsed Doppler evaluation demonstrates normal low-resistance arterial and venous waveforms within the left ovary. Other findings Trace free fluid in the pelvis IMPRESSION: Prior hysterectomy. Small follicles in the left ovary. No torsion. Right ovary not visualized. No adnexal mass seen. Electronically Signed   By: Rolm Baptise M.D.   On: 07/27/2017 11:53   [place] Art/ven Flow Abd Pelv Doppler  Result Date: 07/27/2017 CLINICAL DATA:  Right lower quadrant pain,  intermittent for a year EXAM: TRANSABDOMINAL AND TRANSVAGINAL ULTRASOUND OF PELVIS DOPPLER ULTRASOUND OF OVARIES TECHNIQUE: Both transabdominal and transvaginal ultrasound examinations of the pelvis were performed. Transabdominal technique was performed for global imaging of the pelvis including uterus, ovaries, adnexal regions, and pelvic cul-de-sac. It was necessary to proceed with endovaginal exam following the transabdominal exam to visualize the ovaries/adnexa. Color and duplex Doppler ultrasound was utilized to evaluate blood flow to the ovaries. COMPARISON:  None FINDINGS: Uterus Measurements: Prior hysterectomy. Endometrium Thickness: N/A. Right ovary Measurements: Unable to visualize due to bowel gas. No adnexal mass seen. Left ovary Measurements: 3.4 x 1.6 x 2.3 cm. Multiple small follicles. Normal appearance/no adnexal mass. Pulsed Doppler evaluation demonstrates normal low-resistance arterial and venous waveforms within the left ovary. Other findings Trace free fluid in the pelvis IMPRESSION: Prior hysterectomy. Small follicles in the left ovary. No torsion. Right ovary not visualized. No adnexal mass seen. Electronically Signed   By: Rolm Baptise M.D.   On: 07/27/2017 11:53    Procedures Procedures (including critical care time)  Medications Ordered in ED Medications - No data to display   Initial Impression / Assessment and Plan / ED Course  I have reviewed the triage vital signs and the nursing notes.  Pertinent labs & imaging results that were available during my care of the patient were reviewed by me and considered in my medical decision making (see chart for details).     Patient presents to ED for evaluation of intermittent, sharp right lower quadrant abdominal pain for the past several months.  States the pain lasts about 1 day at a time.  Denies any other associated symptoms including no vaginal discharge, vaginal bleeding, dysuria, changes in bowel habits.  On physical exam  she is overall well-appearing.  She does have some tenderness to palpation of the right lower quadrant but no rebound or guarding present.  I have low suspicion for any acute intra-abdominal abnormality based on the fact that her symptoms have been going on for several months.  Her lab work including CBC, CMP, hCG were all unremarkable.  Urinalysis with no evidence of UTI or other abnormality.  Ultrasound was done to rule out possible ovarian torsion and this returned as unremarkable.  She does have small follicles in the left ovary but no other abnormalities.  Low suspicion for acute intra-abdominal surgical process being the cause of her symptoms based on her lab work and imaging findings today.  I did encourage her to follow-up with either her OB/GYN or the [facility] for further evaluation if symptoms persist.  Patient appears stable for discharge at this time.  Strict return precautions given.  Portions of this note were generated with [employer]. Dictation errors may occur despite best attempts at proofreading.   Final Clinical Impressions(s) / ED Diagnoses   Final diagnoses:  Abdominal wall pain  ED Discharge Orders    None       Delia Heady, PA-C 07/27/17 1326    Carmin Muskrat, MD 07/30/17 2032

## 2017-07-27 NOTE — ED Notes (Signed)
When taking patient to bathroom to get urine specimen, ultrasound stopped [place] and needs bladder to be full for her scans.  Patient was given urine cup with label to take with her for after her ultrasound.

## 2018-01-14 ENCOUNTER — Other Ambulatory Visit: Payer: Self-pay

## 2018-01-14 ENCOUNTER — Encounter (HOSPITAL_COMMUNITY): Payer: Self-pay

## 2018-01-14 ENCOUNTER — Emergency Department (HOSPITAL_COMMUNITY)
Admission: EM | Admit: 2018-01-14 | Discharge: 2018-01-14 | Disposition: A | Discharge status: Home / Self Care | Payer: Medicaid Other | Attending: Emergency Medicine | Admitting: Emergency Medicine

## 2018-01-14 DIAGNOSIS — M545 Low back pain: Secondary | ICD-10-CM | POA: Diagnosis not present

## 2018-01-14 DIAGNOSIS — Z5321 Procedure and treatment not carried out due to patient leaving prior to being seen by health care provider: Secondary | ICD-10-CM | POA: Diagnosis not present

## 2018-01-14 NOTE — ED Triage Notes (Signed)
Pt c/o generalized lower back pain x3 week after stretching. Relieved by laying down, worse with movement. Ambulatory. Denies urinary sx.

## 2018-01-14 NOTE — ED Notes (Signed)
Pt last seen alert and ambulatory. Pt left AMA, but sign not sign AMA

## 2018-01-14 NOTE — ED Notes (Signed)
Pt walks out stating "Hey I'll come back tomorrow, I have to go get my baby." Ambulates independently with no difficulties.

## 2019-01-22 ENCOUNTER — Other Ambulatory Visit: Payer: Self-pay

## 2019-01-22 ENCOUNTER — Emergency Department (HOSPITAL_COMMUNITY)
Admission: EM | Admit: 2019-01-22 | Discharge: 2019-01-22 | Disposition: A | Discharge status: Home / Self Care | Payer: Medicaid Other | Attending: Emergency Medicine | Admitting: Emergency Medicine

## 2019-01-22 ENCOUNTER — Encounter (HOSPITAL_COMMUNITY): Payer: Self-pay | Admitting: Emergency Medicine

## 2019-01-22 DIAGNOSIS — R202 Paresthesia of skin: Secondary | ICD-10-CM | POA: Insufficient documentation

## 2019-01-22 DIAGNOSIS — Z87891 Personal history of nicotine dependence: Secondary | ICD-10-CM | POA: Diagnosis not present

## 2019-01-22 DIAGNOSIS — Z79899 Other long term (current) drug therapy: Secondary | ICD-10-CM | POA: Diagnosis not present

## 2019-01-22 MED ORDER — NAPROXEN 500 MG PO TABS: 500.0000 mg | ORAL_TABLET | Freq: Two times a day (BID) | ORAL | 0 refills | Status: AC

## 2019-01-22 NOTE — Discharge Instructions (Addendum)
Please take medications as prescribed.   Please follow up with your primary care provider within 5-7 days for re-evaluation of your symptoms.   You were also given a referral to neurology.  The  office will contact you to set up an appointment for follow-up.  Return to the emergency department immediately if you experience any back pain associated with fevers, loss of control of your bowels/bladder, weakness/numbness to your legs, numbness to your groin area, inability to walk, or inability to urinate.

## 2019-01-22 NOTE — ED Triage Notes (Signed)
Pt reports that she has right arm numbness that goes down her right leg since 2016. Gotten worse the past week.

## 2019-01-22 NOTE — ED Provider Notes (Signed)
[facility] DEPT Provider Note   CSN: [id number] Arrival date & time: 01/22/19  5993    History   Chief Complaint Chief Complaint  Patient presents with  . Numbness    HPI Lisa Johnson is a 38 y.o. female.     HPI  Pt is a 38 y/o female who presents to the ED today for eval of paresthesias to the RUE and RLE.   RUE paresthesias: States that RUE paresthesias have been present intermittently since 2016 since she had her last c-section. States that for the most part her RUE paresthesias have remained stable, but may be just a little bit worse over the last 2 weeks. Sxs seem to be worse if she sleeps on her right side. Sxs improve when she lays on her left side, throughout the day or after she moves her arm around. She also c/o pain to her right shoulder. Denies neck pain.   RLE paresthesias: She also c/o paresthesias to the RLE that have been chronic but also seemed to worsen about 2 weeks ago. Sxs seem to wax and wane throughout the day. Sxs seem to be worse after sitting for long periods. She also c/o pain to her back. Denies saddle anesthesia. Denies loss of control of bowels or bladder. No urinary retention. No fevers. Denies a h/o IVDU. Denies a h/o CA.  States that prior to the worsening of her sxs, she started a new job 2-3 weeks ago. Since starting this job she states she sits at a stool all day and does not have good back support. 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  No abd pain, NVD, dysuria. Denies any speech problems, facial droop. She thinks she may have some changes of vision to her right eye that started a few days ago. States that sxs are very minimal and she thinks it was because, "I was drinking some wine". Denies significant weakness to the RUE and RLE.   Past Medical History:  Diagnosis Date  . Herpes   . Medical history non-contributory     Patient Active Problem List   Diagnosis Date Noted   . Previous cesarean section   . Placenta previa antepartum in second trimester   . [redacted] weeks gestation of pregnancy     Past Surgical History:  Procedure Laterality Date  . ABDOMINAL HYSTERECTOMY    . CESAREAN SECTION       OB History    Gravida  10   Para  4   Term  4   Preterm  0   AB  5   Living  2     SAB  1   TAB  4   Ectopic  0   Multiple  0   Live Births  1        Obstetric Comments  Baby #5 passed in 2012 d/t Leukodystrophy         Home Medications    Prior to Admission medications   Medication Sig Start Date End Date Taking? Authorizing Provider  valACYclovir (VALTREX) 500 MG tablet Take 500 mg by mouth daily.   Yes [provider]  Vitamin D, Ergocalciferol, (DRISDOL) 1.25 MG (50000 UT) CAPS capsule Take 1 capsule by mouth once a week. 11/28/18  Yes [provider]  ibuprofen (ADVIL,MOTRIN) 600 MG tablet Take 1 tablet (600 mg total) by mouth every 6 (six) hours as needed. Patient not taking: Reported on 06/18/2014 08/03/13  Mabe, Forbes Cellar, MD  naproxen (NAPROSYN) 500 MG tablet Take 1 tablet (500 mg total) by mouth 2 (two) times daily for 7 days. 01/22/19 01/29/19  Ardis Lawley S, PA-C  oxyCODONE-acetaminophen (PERCOCET/ROXICET) 5-325 MG per tablet Take 1-2 tablets by mouth every 6 (six) hours as needed for severe pain. Patient not taking: Reported on 06/18/2014 08/03/13   Pixie Casino, MD    Family History Family History  Problem Relation Age of Onset  . Other Son 2       metochromatic leukodystrophy  . SIDS Daughter     Social History Social History   Tobacco Use  . Smoking status: Former Smoker    Quit date: 10/04/2013    Years since quitting: 5.3  . Smokeless tobacco: Never Used  Substance Use Topics  . Alcohol use: No  . Drug use: No    Comment: not since being pregnant     Allergies   Patient has no known allergies.   Review of Systems Review of Systems  Constitutional: Negative for fever.  HENT:  Negative for ear pain and sore throat.   Respiratory: Positive for cough. Negative for shortness of breath.   Cardiovascular: Negative for chest pain.  Gastrointestinal: Negative for abdominal pain, constipation, diarrhea, nausea and vomiting.  Genitourinary: Negative for dysuria.       No loss of control of bowel or bladder function  Musculoskeletal: Positive for back pain.       Right shoulder pain  Skin: Negative for rash.  Neurological: Positive for numbness (paresthesias). Negative for headaches.  All other systems reviewed and are negative.   Physical Exam Updated Vital Signs BP 126/80   Pulse 65   Temp 98.6 F (37 C) (Oral)   Resp 16   LMP 07/29/2013   SpO2 100%   Physical Exam Vitals signs and nursing note reviewed.  Constitutional:      General: She is not in acute distress.    Appearance: She is well-developed. She is not ill-appearing or toxic-appearing.  HENT:     Head: Normocephalic and atraumatic.     Mouth/Throat:     Mouth: Mucous membranes are moist.  Eyes:     Extraocular Movements: Extraocular movements intact.     Conjunctiva/sclera: Conjunctivae normal.     Pupils: Pupils are equal, round, and reactive to light.  Neck:     Musculoskeletal: Neck supple.  Cardiovascular:     Rate and Rhythm: Normal rate and regular rhythm.     Pulses: Normal pulses.     Heart sounds: Normal heart sounds. No murmur.  Pulmonary:     Effort: Pulmonary effort is normal. No respiratory distress.     Breath sounds: Normal breath sounds. No wheezing, rhonchi or rales.  Abdominal:     Palpations: Abdomen is soft.     Tenderness: There is no abdominal tenderness.  Musculoskeletal:     Comments: No midline ttp. TTP to the right trapezius muscle.   Skin:    General: Skin is warm and dry.  Neurological:     Mental Status: She is alert.     Deep Tendon Reflexes: Reflexes normal.     Comments: Mental Status:  Alert, thought content appropriate, able to give a coherent  history. Speech fluent without evidence of aphasia. Able to follow 2 step commands without difficulty.  Cranial Nerves:  II:   pupils equal, round, reactive to light III,IV, VI: ptosis not present, extra-ocular motions intact bilaterally  V,VII: smile symmetric, facial light touch  sensation equal VIII: hearing grossly normal to voice  X: uvula elevates symmetrically  XI: bilateral shoulder shrug symmetric and strong XII: midline tongue extension without fassiculations Motor:  Normal tone. 5/5 strength of BUE and BLE major muscle groups including strong and equal grip strength and dorsiflexion/plantar flexion Sensory: abnormal sensation to the RUE And RLE, otherwise wnl Gait: normal gait and balance.    ED Treatments / Results  Labs (all labs ordered are listed, but only abnormal results are displayed) Labs Reviewed - No data to display  EKG None  Radiology No results found.  Procedures Procedures (including critical care time)  Medications Ordered in ED Medications - No data to display   Initial Impression / Assessment and Plan / ED Course  I have reviewed the triage vital signs and the nursing notes.  Pertinent labs & imaging results that were available during my care of the patient were reviewed by me and considered in my medical decision making (see chart for details).     Final Clinical Impressions(s) / ED Diagnoses   Final diagnoses:  Paresthesia   38 y/o female presenting to the ED c/o RUE and RLE paresthesias ongoing chronically but worsening for the last 2 weeks after starting a new job. Sxs seem to worsen in certain positions and improve with certain movements/different positions.  No red flag sxs of back pain and pts neuro exam is grossly normal with exception of abnormal sensation to the RUE and RLE. Normal facial sensation. Cranial nerves II-XII intact.   Sxs seems less likely related to [place], CVA, or other emergent neurologic cause that would require  further w/u in the ED or admission.  Sxs may be related to radiculopathy and will trial a course of NSAIDs and have her f/u with pcp in 1 week for reeval. Have also given her a referral to neurology for further w/u as she may need imaging given the waxing waning nature of sxs and worsening of sxs as she states she has not had a w/u for this yet. Have advised her on specific precautions that would warrant immediate return to the ED. She voices understanding and is in agreement with the plan. All questions answered, pt stable for d.c     ED Discharge Orders         Ordered    Ambulatory referral to Neurology    Comments: An appointment is requested in approximately: 2 weeks   01/22/19 1005    naproxen (NAPROSYN) 500 MG tablet  2 times daily     01/22/19 [address] Korbin Notaro S, PA-C 01/22/19 1104    Blanchie Dessert, MD 01/24/19 1730

## 2019-03-05 ENCOUNTER — Telehealth: Payer: Self-pay

## 2019-03-05 ENCOUNTER — Ambulatory Visit: Payer: Self-pay | Admitting: Neurology

## 2019-03-05 NOTE — Telephone Encounter (Signed)
Pt did not show for their appt with Dr. Athar today.  

## 2019-03-06 ENCOUNTER — Encounter: Payer: Self-pay | Admitting: Neurology

## 2019-06-12 ENCOUNTER — Ambulatory Visit: Payer: Medicaid Other | Attending: Internal Medicine

## 2019-06-12 DIAGNOSIS — Z20822 Contact with and (suspected) exposure to covid-19: Secondary | ICD-10-CM

## 2019-06-13 LAB — NOVEL CORONAVIRUS, NAA: SARS-CoV-2, NAA: NOT DETECTED

## 2020-01-31 ENCOUNTER — Other Ambulatory Visit: Payer: Self-pay

## 2020-01-31 DIAGNOSIS — Z20822 Contact with and (suspected) exposure to covid-19: Secondary | ICD-10-CM

## 2020-02-01 LAB — NOVEL CORONAVIRUS, NAA: SARS-CoV-2, NAA: NOT DETECTED

## 2020-02-01 LAB — SARS-COV-2, NAA 2 DAY TAT

## 2020-02-18 ENCOUNTER — Other Ambulatory Visit: Payer: Medicaid Other

## 2020-02-18 ENCOUNTER — Other Ambulatory Visit: Payer: Self-pay

## 2020-02-18 DIAGNOSIS — Z20822 Contact with and (suspected) exposure to covid-19: Secondary | ICD-10-CM

## 2020-02-20 LAB — SARS-COV-2, NAA 2 DAY TAT

## 2020-02-20 LAB — NOVEL CORONAVIRUS, NAA: SARS-CoV-2, NAA: DETECTED — AB

## 2020-02-21 ENCOUNTER — Encounter: Payer: Self-pay | Admitting: Nurse Practitioner

## 2020-02-21 ENCOUNTER — Telehealth: Payer: Self-pay | Admitting: Nurse Practitioner

## 2020-02-21 DIAGNOSIS — U071 COVID-19: Secondary | ICD-10-CM

## 2020-02-21 NOTE — Telephone Encounter (Signed)
Called to Discuss with patient about Covid symptoms and the use of regeneron, a monoclonal antibody infusion for those with mild to moderate Covid symptoms and at a high risk of hospitalization.     Pt is qualified for this infusion at the [facility] infusion center due to co-morbid conditions and/or a member of an at-risk group.     Unable to reach pt. Left message to return call. Sent mychart message.   Ovila Lepage, DNP, AGNP-C [phone] (Infusion Center Hotline)  

## 2020-02-25 ENCOUNTER — Other Ambulatory Visit: Payer: Medicaid Other

## 2020-02-28 ENCOUNTER — Other Ambulatory Visit: Payer: Self-pay

## 2020-03-03 ENCOUNTER — Other Ambulatory Visit: Payer: BLUE CROSS/BLUE SHIELD

## 2020-03-03 DIAGNOSIS — T7632XA Child psychological abuse, suspected, initial encounter: Secondary | ICD-10-CM

## 2020-03-04 LAB — SARS-COV-2, NAA 2 DAY TAT

## 2020-03-04 LAB — NOVEL CORONAVIRUS, NAA: SARS-CoV-2, NAA: NOT DETECTED

## 2020-05-25 ENCOUNTER — Other Ambulatory Visit: Payer: Self-pay | Admitting: Internal Medicine

## 2020-05-25 DIAGNOSIS — Z1231 Encounter for screening mammogram for malignant neoplasm of breast: Secondary | ICD-10-CM

## 2020-05-27 ENCOUNTER — Ambulatory Visit
Admission: RE | Admit: 2020-05-27 | Discharge: 2020-05-27 | Disposition: A | Discharge status: Home / Self Care | Payer: Self-pay | Source: Ambulatory Visit | Attending: Internal Medicine | Admitting: Internal Medicine

## 2020-05-27 ENCOUNTER — Other Ambulatory Visit: Payer: Self-pay

## 2020-05-27 DIAGNOSIS — Z1231 Encounter for screening mammogram for malignant neoplasm of breast: Secondary | ICD-10-CM

## 2020-10-15 ENCOUNTER — Other Ambulatory Visit: Payer: Self-pay | Admitting: Internal Medicine

## 2020-10-16 LAB — COMPLETE METABOLIC PANEL WITH GFR
AG Ratio: 1.9 (calc) (ref 1.0–2.5)
ALT: 12 U/L (ref 6–29)
AST: 11 U/L (ref 10–30)
Albumin: 4.5 g/dL (ref 3.6–5.1)
Alkaline phosphatase (APISO): 68 U/L (ref 31–125)
BUN: 15 mg/dL (ref 7–25)
CO2: 20 mmol/L (ref 20–32)
Calcium: 9.1 mg/dL (ref 8.6–10.2)
Chloride: 108 mmol/L (ref 98–110)
Creat: 1.01 mg/dL (ref 0.50–1.10)
GFR, Est African American: 80 mL/min/{1.73_m2} (ref >=60)
GFR, Est Non African American: 69 mL/min/{1.73_m2} (ref >=60)
Globulin: 2.4 g/dL (calc) (ref 1.9–3.7)
Glucose, Bld: 95 mg/dL (ref 65–99)
Potassium: 3.6 mmol/L (ref 3.5–5.3)
Sodium: 139 mmol/L (ref 135–146)
Total Bilirubin: 0.4 mg/dL (ref 0.2–1.2)
Total Protein: 6.9 g/dL (ref 6.1–8.1)

## 2020-10-16 LAB — LIPID PANEL
Cholesterol: 163 mg/dL (ref <200)
HDL: 65 mg/dL (ref >=50)
LDL Cholesterol (Calc): 78 mg/dL (calc)
Non-HDL Cholesterol (Calc): 98 mg/dL (calc) (ref <130)
Total CHOL/HDL Ratio: 2.5 (calc) (ref <5.0)
Triglycerides: 118 mg/dL (ref <150)

## 2020-10-16 LAB — CBC
HCT: 39.3 % (ref 35.0–45.0)
Hemoglobin: 13.1 g/dL (ref 11.7–15.5)
MCH: 32.3 pg (ref 27.0–33.0)
MCHC: 33.3 g/dL (ref 32.0–36.0)
MCV: 96.8 fL (ref 80.0–100.0)
MPV: 9.7 fL (ref 7.5–12.5)
Platelets: 260 10*3/uL (ref 140–400)
RBC: 4.06 10*6/uL (ref 3.80–5.10)
RDW: 11.9 % (ref 11.0–15.0)
WBC: 6.4 10*3/uL (ref 3.8–10.8)

## 2020-10-16 LAB — TSH: TSH: 0.78 mIU/L

## 2020-10-16 LAB — VITAMIN D 25 HYDROXY (VIT D DEFICIENCY, FRACTURES): Vit D, 25-Hydroxy: 24 ng/mL — ABNORMAL LOW (ref 30–100)

## 2021-03-08 ENCOUNTER — Other Ambulatory Visit: Payer: Self-pay | Admitting: Internal Medicine

## 2021-03-10 LAB — C. TRACHOMATIS/N. GONORRHOEAE RNA
C. trachomatis RNA, TMA: NOT DETECTED
N. gonorrhoeae RNA, TMA: NOT DETECTED

## 2021-12-01 ENCOUNTER — Encounter: Payer: Self-pay | Admitting: Obstetrics

## 2021-12-01 ENCOUNTER — Ambulatory Visit (INDEPENDENT_AMBULATORY_CARE_PROVIDER_SITE_OTHER): Payer: Medicaid Other | Admitting: Student

## 2021-12-01 ENCOUNTER — Other Ambulatory Visit (HOSPITAL_COMMUNITY)
Admission: RE | Admit: 2021-12-01 | Discharge: 2021-12-01 | Disposition: A | Discharge status: Home / Self Care | Payer: Medicaid Other | Source: Ambulatory Visit | Attending: Student | Admitting: Student

## 2021-12-01 ENCOUNTER — Encounter: Payer: Self-pay | Admitting: Student

## 2021-12-01 VITALS — BP 110/73 | HR 70 | Ht 64.0 in | Wt 169.5 lb

## 2021-12-01 DIAGNOSIS — Z01419 Encounter for gynecological examination (general) (routine) without abnormal findings: Secondary | ICD-10-CM

## 2021-12-01 DIAGNOSIS — Z113 Encounter for screening for infections with a predominantly sexual mode of transmission: Secondary | ICD-10-CM

## 2021-12-01 DIAGNOSIS — F439 Reaction to severe stress, unspecified: Secondary | ICD-10-CM

## 2021-12-01 DIAGNOSIS — Z7689 Persons encountering health services in other specified circumstances: Secondary | ICD-10-CM

## 2021-12-01 NOTE — Progress Notes (Signed)
ANNUAL EXAM Patient name: Lisa Johnson MRN [id number]  Date of birth: [birth year] Chief Complaint:   Annual exam and establish care.   History of Present Illness:   Lisa Johnson is a 41 y.o. [id number] African-American female being seen today for a routine annual exam.  Current complaints: None. Patient reports pressure in rectum x1 after drinking a bottle of prune juice. Patient felt relief after bowel movement and has not experienced that since. Patient shares that she is managing a lot of challenging life situations right now and has noticed an increase in her alcohol consumption.  Patient's last menstrual period was 07/29/2013.   [facility] - 12/01/21 0836       Pregnancy Intention Screening   Does the patient want to become pregnant in the next year? No    Does the patient's partner want to become pregnant in the next year? No    Would the patient like to discuss contraceptive options today? No            The pregnancy intention screening data noted above was reviewed. Potential methods of contraception were discussed. The patient elected to proceed with No data recorded.   Last pap 2022, per patient. Results were:  negative .  Last mammogram: 2021. Results were: normal.  Last colonoscopy: n/a. Results were: N/A.      12/01/2021    8:42 AM 05/02/2014    8:25 AM 04/04/2014    3:29 PM 02/28/2014    9:16 AM 02/25/2014   11:12 AM  Depression screen PHQ 2/9  Decreased Interest 0 0 0 0 0  Down, Depressed, Hopeless 1 0 0 0 0  PHQ - 2 Score 1 0 0 0 0  Altered sleeping 1      Tired, decreased energy 0      Change in appetite 0      Feeling bad or failure about yourself  1      Moving slowly or fidgety/restless 0      Suicidal thoughts 1      PHQ-9 Score 4            12/01/2021    8:46 AM  GAD 7 : Generalized Anxiety Score  Nervous, Anxious, on Edge 1  Control/stop worrying 1  Worry too much - different things 1  Trouble relaxing 1  Restless 1  Easily annoyed  or irritable 1  Afraid - awful might happen 1  Total GAD 7 Score 7     Review of Systems:   Pertinent items are noted in HPI Denies any headaches, blurred vision, fatigue, shortness of breath, chest pain, abdominal pain, abnormal vaginal discharge/itching/odor/irritation, problems with periods, bowel movements, urination, or intercourse unless otherwise stated above. Pertinent History Reviewed:  Reviewed past medical,surgical, social and family history.  Reviewed problem list, medications and allergies. Physical Assessment:   Vitals:   12/01/21 0826  BP: 110/73  Pulse: 70  Weight: 169 lb 8 oz (76.9 kg)  Height: '5\' 4"'$  (1.626 m)  Body mass index is 29.09 kg/m.        Physical Examination:   General appearance - well appearing, and in no distress  Mental status - alert, oriented to person, place, and time  Psych:  She has a normal mood and affect  Skin - warm and dry, normal color, no suspicious lesions noted  Chest - effort normal, all lung fields clear to auscultation bilaterally  Heart - normal rate and regular rhythm  Neck:  midline trachea, no  thyromegaly or nodules  Breasts - breasts appear normal, no suspicious masses, no skin or nipple changes or  axillary nodes  Abdomen - soft, nontender, nondistended, no masses or organomegaly  Pelvic - VULVA: normal appearing vulva with no masses, tenderness or lesions  VAGINA: normal appearing vagina with normal color and discharge, no lesions  CERVIX: normal appearing cervix without discharge or lesions, no CMT  Thin prep pap is done, HR HPV cotesting  UTERUS: uterus is felt to be normal size, shape, consistency and nontender   ADNEXA: No adnexal masses or tenderness noted.  Extremities:  No swelling or varicosities noted  Chaperone present for exam  No results found for this or any previous visit (from the past 24 hour(s)).  Assessment & Plan:  1. Women's annual routine gynecological examination - Normal exam - Ambulatory  referral to [facility] ancillary only( [place]) - Cytology - PAP( [place]) - HIV Antibody (routine testing w rflx) - RPR - Hepatitis C Antibody - Hepatitis B Surface AntiGEN - MM DIGITAL SCREENING BILATERAL; Future  2. Routine screening for STI (sexually transmitted infection) -Full panel collected, with will manage based on results  3. Encounter to establish care   4. Situational stress -ambulatory referral made to behavioral health - denies any plans or thoughts of self-harm  Labs/procedures today: see below  Mammogram: schedule screening mammo as soon as possible, or sooner if problems Colonoscopy: @ 43yo, or sooner if problems  Orders Placed This Encounter  Procedures   HIV Antibody (routine testing w rflx)   RPR   Hepatitis C Antibody   Hepatitis B Surface AntiGEN   Ambulatory referral to [facility]: No orders of the defined types were placed in this encounter.   Follow-up: Return in about 1 month (around 12/31/2021) for VIRTUAL. For wellness check  Johnston Ebbs, NP 12/01/2021 9:39 AM

## 2021-12-01 NOTE — Progress Notes (Signed)
Patient presents for AEX. Last Pap: possibly last year, normal, per patient  Last Mammogram: 05/27/20- normal Contraception: hx hysterectomy 2016, unsure if she has cervix  Vaginal/Urinary Symptoms: endorses vaginal odor on and off STD Screen: Desires STD screen, blood work  Other Concerns: Reports rectal pressure after bowel movements  PHQ9-Has several days of feeling that you be better off dead or hurting in some way.

## 2021-12-02 LAB — CERVICOVAGINAL ANCILLARY ONLY
Bacterial Vaginitis (gardnerella): POSITIVE — AB
Candida Glabrata: NEGATIVE
Candida Vaginitis: NEGATIVE
Chlamydia: NEGATIVE
Comment: NEGATIVE
Comment: NEGATIVE
Comment: NEGATIVE
Comment: NEGATIVE
Comment: NEGATIVE
Comment: NORMAL
Neisseria Gonorrhea: NEGATIVE
Trichomonas: NEGATIVE

## 2021-12-03 LAB — RPR: RPR Ser Ql: NONREACTIVE

## 2021-12-03 LAB — HEPATITIS C ANTIBODY: Hep C Virus Ab: NONREACTIVE

## 2021-12-03 LAB — HIV ANTIBODY (ROUTINE TESTING W REFLEX): HIV Screen 4th Generation wRfx: NONREACTIVE

## 2021-12-03 LAB — HEPATITIS B SURFACE ANTIGEN: Hepatitis B Surface Ag: NEGATIVE

## 2021-12-06 LAB — CYTOLOGY - PAP
Comment: NEGATIVE
Diagnosis: NEGATIVE
Diagnosis: REACTIVE
High risk HPV: NEGATIVE

## 2021-12-09 ENCOUNTER — Ambulatory Visit: Payer: Medicaid Other | Admitting: Licensed Clinical Social Worker

## 2021-12-09 NOTE — BH Specialist Note (Deleted)
Erroneous encounter

## 2021-12-10 ENCOUNTER — Ambulatory Visit (INDEPENDENT_AMBULATORY_CARE_PROVIDER_SITE_OTHER): Payer: Medicaid Other | Admitting: Licensed Clinical Social Worker

## 2021-12-10 DIAGNOSIS — F439 Reaction to severe stress, unspecified: Secondary | ICD-10-CM | POA: Diagnosis not present

## 2021-12-10 NOTE — BH Specialist Note (Signed)
Integrated Behavioral Health via Telemedicine Visit  12/10/2021 Lisa Johnson [id number]  Number of [facility] Clinician visits: No data recorded Session Start time: 1113   Session End time: 1140  Total time in minutes: 27 Via phone per pt request.   Referring Provider: Morrison Old NP Patient/Family location: Work  [facility] Provider location: [facility]  All persons participating in visit: Pt Lisa Johnson and LCSW A. Linton Rump Types of Service: Family psychotherapy and Telephone visit  I connected with Lisa Johnson and/or Lisa Johnson's n/a via  Telephone or Video Enabled Telemedicine Application  (Video is Caregility application) and verified that I am speaking with the correct person using two identifiers. Discussed confidentiality: Yes   I discussed the limitations of telemedicine and the availability of in person appointments.  Discussed there is a possibility of technology failure and discussed alternative modes of communication if that failure occurs.  I discussed that engaging in this telemedicine visit, they consent to the provision of behavioral healthcare and the services will be billed under their insurance.  Patient and/or legal guardian expressed understanding and consented to Telemedicine visit: Yes   Presenting Concerns: Patient and/or family reports the following symptoms/concerns: situational stress and alcohol consumption Duration of problem: approx 4 years; Severity of problem: mild  Patient and/or Family's Strengths/Protective Factors: Concrete supports in place (healthy food, safe environments, etc.)  Goals Addressed: Patient will:  Reduce symptoms of: stress and excessive alcohol consumption  Increase knowledge and/or ability of: coping skills  and stress reduction  Demonstrate ability to: Increase healthy adjustment to current life circumstances  Progress towards Goals: Ongoing  Interventions: Interventions utilized:  Supportive  Counseling Standardized Assessments completed: PHQ 9  Patient and/or Family Response: Lisa Johnson responded well to phone visit  Assessment: Patient currently experiencing situational stress.   Patient may benefit from outpatient behavioral health.  Plan: Follow up with behavioral health clinician on : as needed  Behavioral recommendations: develop smart goals, avoid family conflict, self care techniques Referral(s): [facility] (LME/Outside Clinic)  I discussed the assessment and treatment plan with the patient and/or parent/guardian. They were provided an opportunity to ask questions and all were answered. They agreed with the plan and demonstrated an understanding of the instructions.   They were advised to call back or seek an in-person evaluation if the symptoms worsen or if the condition fails to improve as anticipated.  Lynnea Ferrier, LCSW

## 2021-12-17 ENCOUNTER — Inpatient Hospital Stay (HOSPITAL_BASED_OUTPATIENT_CLINIC_OR_DEPARTMENT_OTHER): Admission: RE | Admit: 2021-12-17 | Payer: Medicaid Other | Source: Ambulatory Visit | Admitting: Radiology

## 2021-12-30 ENCOUNTER — Telehealth (INDEPENDENT_AMBULATORY_CARE_PROVIDER_SITE_OTHER): Payer: Medicaid Other | Admitting: Student

## 2021-12-30 DIAGNOSIS — B9689 Other specified bacterial agents as the cause of diseases classified elsewhere: Secondary | ICD-10-CM

## 2021-12-30 DIAGNOSIS — Z1231 Encounter for screening mammogram for malignant neoplasm of breast: Secondary | ICD-10-CM

## 2021-12-30 DIAGNOSIS — F439 Reaction to severe stress, unspecified: Secondary | ICD-10-CM

## 2021-12-30 DIAGNOSIS — N760 Acute vaginitis: Secondary | ICD-10-CM | POA: Diagnosis not present

## 2021-12-30 MED ORDER — METRONIDAZOLE 0.75 % VA GEL: 1.0000 | Freq: Every day | VAGINAL | 0 refills | Status: DC

## 2021-12-30 NOTE — Progress Notes (Signed)
    GYNECOLOGY VIRTUAL VISIT ENCOUNTER NOTE  Provider location: [facility] for [place] [facility]   Patient location: Home  I connected with Lisa Johnson on 12/30/21 at 11:15 AM EDT by MyChart Video Encounter and verified that I am speaking with the correct person using two identifiers.   I discussed the limitations, risks, security and privacy concerns of performing an evaluation and management service virtually and the availability of in person appointments. I also discussed with the patient that there may be a patient responsible charge related to this service. The patient expressed understanding and agreed to proceed.   History:  Lisa Johnson is a 41 y.o. [id number] female being evaluated today for follow-up on wellness visits. She denies any abnormal vaginal discharge, bleeding, pelvic pain or other concerns.       Past Medical History:  Diagnosis Date   Herpes    Medical history non-contributory    Past Surgical History:  Procedure Laterality Date   ABDOMINAL HYSTERECTOMY     CESAREAN SECTION     The following portions of the patient's history were reviewed and updated as appropriate: allergies, current medications, past family history, past medical history, past social history, past surgical history and problem list.   Health Maintenance:  Normal pap and negative HRHPV on 11/2021.  Normal mammogram on 2021.   Review of Systems:  Pertinent items noted in HPI and remainder of comprehensive ROS otherwise negative.  Physical Exam:   General:  Alert, oriented and cooperative. Patient appears to be in no acute distress.  Mental Status: Normal mood and affect. Normal behavior. Normal judgment and thought content.   Respiratory: Normal respiratory effort, no problems with respiration noted  Rest of physical exam deferred due to type of encounter  Labs and Imaging No results found for this or any previous visit (from the past 336 hour(s)). No results found.      Assessment and Plan:     1. Situational stress - Maintaining, requesting further support with IBH -patient would like resources for family counseling  - Amb ref to [place]  2. Bacterial vaginosis - BV present at last visit, untreated  - metroNIDAZOLE (METROGEL VAGINAL) 0.75 % vaginal gel; Place 1 Applicatorful vaginally at bedtime. Insert one applicator, at bedtime, for 5 nights.  Dispense: 70 g; Refill: 0  3. Screening mammogram for breast cancer - Routine screening ordered - MM DIGITAL SCREENING BILATERAL; Future      I discussed the assessment and treatment plan with the patient. The patient was provided an opportunity to ask questions and all were answered. The patient agreed with the plan and demonstrated an understanding of the instructions.   The patient was advised to call back or seek an in-person evaluation/go to the ED if the symptoms worsen or if the condition fails to improve as anticipated.  I provided 10 minutes of face-to-face time during this encounter.   Lisa Ebbs, NP Center for [facility], [place]

## 2022-01-03 ENCOUNTER — Telehealth: Payer: Self-pay | Admitting: Licensed Clinical Social Worker

## 2022-01-03 ENCOUNTER — Encounter: Payer: Medicaid Other | Admitting: Licensed Clinical Social Worker

## 2022-01-03 NOTE — Telephone Encounter (Signed)
Called pt regarding scheduled mychart visit. Left message requesting callback  

## 2022-03-04 ENCOUNTER — Ambulatory Visit: Payer: Medicaid Other

## 2022-04-13 NOTE — BH Specialist Note (Signed)
Opened in error

## 2022-04-14 ENCOUNTER — Other Ambulatory Visit (HOSPITAL_COMMUNITY): Payer: Self-pay

## 2022-04-14 ENCOUNTER — Other Ambulatory Visit: Payer: Self-pay | Admitting: *Deleted

## 2022-04-14 DIAGNOSIS — N760 Acute vaginitis: Secondary | ICD-10-CM

## 2022-04-14 MED ORDER — METRONIDAZOLE 0.75 % VA GEL
1.0000 | Freq: Every day | VAGINAL | 0 refills | Status: DC
  Filled 2022-04-14: qty 70, 7d supply, fill #0

## 2022-04-14 NOTE — Progress Notes (Signed)
Refill Metrogel RX requested via pharmacy. Pt UTD on annual exam (12/2021). RX per protocol.

## 2022-04-18 ENCOUNTER — Other Ambulatory Visit (HOSPITAL_COMMUNITY)
Admission: RE | Admit: 2022-04-18 | Discharge: 2022-04-18 | Disposition: A | Discharge status: Home / Self Care | Payer: Medicaid Other | Source: Ambulatory Visit | Attending: Obstetrics and Gynecology | Admitting: Obstetrics and Gynecology

## 2022-04-18 ENCOUNTER — Other Ambulatory Visit: Payer: Self-pay

## 2022-04-18 ENCOUNTER — Ambulatory Visit (INDEPENDENT_AMBULATORY_CARE_PROVIDER_SITE_OTHER): Payer: Medicaid Other

## 2022-04-18 VITALS — Ht 65.0 in | Wt 168.4 lb

## 2022-04-18 DIAGNOSIS — Z202 Contact with and (suspected) exposure to infections with a predominantly sexual mode of transmission: Secondary | ICD-10-CM

## 2022-04-18 DIAGNOSIS — Z113 Encounter for screening for infections with a predominantly sexual mode of transmission: Secondary | ICD-10-CM | POA: Diagnosis not present

## 2022-04-18 DIAGNOSIS — R300 Dysuria: Secondary | ICD-10-CM

## 2022-04-18 LAB — POCT URINALYSIS DIPSTICK
Bilirubin, UA: NEGATIVE
Blood, UA: NEGATIVE
Glucose, UA: NEGATIVE
Ketones, UA: NEGATIVE
Leukocytes, UA: NEGATIVE
Nitrite, UA: NEGATIVE
Protein, UA: NEGATIVE
Spec Grav, UA: 1.02 (ref 1.010–1.025)
Urobilinogen, UA: 0.2 E.U./dL
pH, UA: 6.5 (ref 5.0–8.0)

## 2022-04-18 MED ORDER — DOXYCYCLINE HYCLATE 100 MG PO CAPS: 100.0000 mg | ORAL_CAPSULE | Freq: Two times a day (BID) | ORAL | 0 refills | Status: AC

## 2022-04-18 NOTE — Progress Notes (Signed)
Patient presents for STD screening. Patient states that she her partner tested positive for NGU. Patient denies having any vaginal discharge, vaginal odor, or irritation. She states that she did have some pain with urination about a week and a half ago, but sx are resolved now. Patient also request to be tested for yeast and bv.

## 2022-04-19 LAB — CERVICOVAGINAL ANCILLARY ONLY
Bacterial Vaginitis (gardnerella): POSITIVE — AB
Candida Glabrata: NEGATIVE
Candida Vaginitis: NEGATIVE
Chlamydia: NEGATIVE
Comment: NEGATIVE
Comment: NEGATIVE
Comment: NEGATIVE
Comment: NEGATIVE
Comment: NEGATIVE
Comment: NORMAL
Neisseria Gonorrhea: NEGATIVE
Trichomonas: POSITIVE — AB

## 2022-04-19 MED ORDER — METRONIDAZOLE 500 MG PO TABS: 500.0000 mg | ORAL_TABLET | Freq: Two times a day (BID) | ORAL | 0 refills | Status: DC

## 2022-04-19 NOTE — Addendum Note (Signed)
Addended by: Mora Bellman on: 04/19/2022 03:35 PM   Modules accepted: Orders

## 2022-04-19 NOTE — Progress Notes (Signed)
TC to both numbers on file. Home/ Mobile number call cannot be completed. Work number answered as wrong number. Deleted from file. Pt is active in [place]. Message sent via MyChart including [place], RX and pt education.

## 2022-04-20 ENCOUNTER — Telehealth: Payer: Self-pay | Admitting: Emergency Medicine

## 2022-04-20 LAB — URINE CULTURE

## 2022-04-20 NOTE — Telephone Encounter (Signed)
Attempted TC to patient. Call unable to be completed, unable to leave message.  Mychart message with results sent 11/14. Rx sent 11/14.

## 2022-04-26 ENCOUNTER — Other Ambulatory Visit (HOSPITAL_COMMUNITY): Payer: Self-pay

## 2022-05-05 ENCOUNTER — Other Ambulatory Visit: Payer: Self-pay | Admitting: Internal Medicine

## 2022-05-06 LAB — C. TRACHOMATIS/N. GONORRHOEAE RNA
C. trachomatis RNA, TMA: NOT DETECTED
N. gonorrhoeae RNA, TMA: NOT DETECTED

## 2022-06-02 ENCOUNTER — Ambulatory Visit: Payer: Medicaid Other | Admitting: Licensed Clinical Social Worker

## 2023-06-13 NOTE — ED Provider Notes (Signed)
 EM Observation Re-evaluation note  Lisa Johnson is a 43 y.o. female, seen on rounds today.  The patient presented with a chief complaint of alcohol intoxication. Currently, the patient is resting comfortably.  The patient denies suicidal ideation.   Temp:  [97.8 F (36.6 C)] 97.8 F (36.6 C) Heart Rate:  [93-104] 93 Resp:  [16-18] 16 BP: (112-129)/(71-93) 112/71 SpO2:  [98 %-100 %] 98 %  Psychiatric examination: calm, resting comfortably. Thought process seems logical. Thought content is currently negative for suicidal ideation.  MDM:  I have reviewed the labs performed to date as well as medications administered while in observation. Recent changes in the last 24 hours include none.  Current plan is for allow alcohol to metabolize.  Assessment team/psychiatry team evaluation for alcohol detox.  Patient is not under full IVC at this time.    This document serves as a record of services personally performed by Fairy Brain, MD. It was created on their behalf by Deatrice VEAR Bathe, Scribe, a trained medical scribe. The creation of this record is the provider's dictation and/or activities during the visit.   Electronically signed by: Deatrice VEAR Bathe, Scribe 06/13/2023 7:44 AM  I agree the documentation is accurate and complete. Reviewed by: Fairy Brain, MD 06/13/2023 7:44 AM

## 2023-06-13 NOTE — ED Provider Notes (Signed)
 [facility] Emergency Department Emergency Medicine Provider Note    History   Chief Complaint: Alcohol Problem.  History of Present Illness:  History obtained from: Patient   Lisa Johnson is a 43 y.o. female who presents to the ED via [facility] with complaints of alcohol intoxication. States she was brought in by EMS after her 4 year old daughter called her father who then called EMS. Patient lives with her 17 year old daughter, the father does not live with them. Patient reports she is a heavy weed smoker. Patient does not give a clear answer as to how long she has been drinking. Unable to obtain a more thorough history at this time due to patient's level of intoxication.   Per triage, patient reports she is going through a lot of shit right now... and I can't deal with the shit I'm going through. Endorsed anxiety and depression. Denied SI/HI.  ______________________ ROS: Pertinent positives and negatives per HPI. Pertinent past medical, surgical, social and family history records were reviewed. Current Medications and Allergies were reviewed.  Physical Exam   Vitals:   06/13/23 0200 06/13/23 0421 06/13/23 0500 06/13/23 0600  BP: 121/82 118/78 118/73 112/71  BP Location: Left arm Right arm Left arm Left arm  Patient Position: Lying Lying Lying Lying  Pulse: 97 99 104 93  Resp: 16 17 16 16   Temp:      SpO2: 98% 100% 99% 98%  Weight:      Height:          Physical Exam Vitals and nursing note reviewed.  Constitutional:      Appearance: Normal appearance. She is not toxic-appearing or diaphoretic.  HENT:     Head: Normocephalic and atraumatic.     Nose: Nose normal.     Mouth/Throat:     Mouth: Mucous membranes are moist.  Eyes:     Conjunctiva/sclera: Conjunctivae normal.  Cardiovascular:     Rate and Rhythm: Normal rate.     Pulses: Normal pulses.  Pulmonary:     Effort: Pulmonary effort is normal. No respiratory distress.     Breath sounds:  Normal breath sounds.  Abdominal:     General: There is no distension.     Tenderness: There is no abdominal tenderness.  Musculoskeletal:        General: Normal range of motion.     Cervical back: Normal range of motion and neck supple. No rigidity.  Skin:    General: Skin is warm and dry.     Capillary Refill: Capillary refill takes less than 2 seconds.  Neurological:     Comments: Somnolent.  Intoxicated appearing.  Psychiatric:        Cognition and Memory: Cognition is impaired.     Results    Labs: Labs Reviewed  DRUG OF ABUSE 7 PANEL - Abnormal      Result Value   Amphetamines Screen, Urine Negative     Barbiturates Screen, Urine Negative     Benzodiazepines Screen, Urine Negative     Cocaine Screen, Urine Negative     Opiates Screen, Urine Negative     Fentanyl Screen, Urine Negative     Marijuana (THC) Screen, Urine Positive (*)    Creatinine, Urine 58     Narrative:    These results are for medical purposes only.  The screening procedure aims to detect the presence of drugs and it is regarded as presumptive (qualitative analysis).  Positive screening detection requires confirmation using more sensitive  and specific quantitative methods, such as high performance liquid chromatography and mass spectrometry.  COMPREHENSIVE METABOLIC PANEL - Abnormal   Sodium 143     Potassium 3.7     Chloride 110 (*)    CO2 21     Anion Gap 12     Glucose, Random 85     Blood Urea Nitrogen (BUN) 9     Creatinine 0.67     eGFR >90     Albumin 4.7     Total Protein 7.9     Bilirubin, Total 0.3     Alkaline Phosphatase (ALP) 76     Aspartate Aminotransferase (AST) 20     Alanine Aminotransferase (ALT) 17     Calcium 8.6     BUN/Creatinine Ratio      ETHANOL - Abnormal   Ethanol 371 (*)   CBC WITH DIFFERENTIAL - Abnormal   WBC 4.40     RBC 4.27     Hemoglobin 14.4     Hematocrit 41.6     Mean Corpuscular Volume (MCV) 97.5 (*)    Mean Corpuscular Hemoglobin (MCH) 33.8 (*)     Mean Corpuscular Hemoglobin Conc (MCHC) 34.7     Red Cell Distribution Width (RDW) 13.5     Platelet Count (PLT) 338     Mean Platelet Volume (MPV) 6.9     Neutrophils % 59     Lymphocytes % 33     Monocytes % 3     Eosinophils % 4     Basophils % 1     Neutrophils Absolute 2.60     Lymphocytes # 1.50     Monocytes # 0.10     Eosinophils # 0.20     Basophils # 0.00    URINALYSIS WITH REFLEX TO MICROSCOPIC - Normal   Color, Urine Colorless     Clarity, Urine Clear     Specific Gravity, Urine 1.009     pH, Urine 5.0     Protein, Urine Negative     Glucose, Urine Negative     Ketones, Urine Trace     Bilirubin, Urine Negative     Blood, Urine Negative     Nitrite, Urine Negative     Leukocyte Esterase, Urine Negative     Urobilinogen, Urine Normal     Narrative:    Microscopic not indicated  MAGNESIUM - Normal   Magnesium 2.1    PHOSPHORUS - Normal   Phosphorus 3.6        ED Course & Medical Decision Making   External records were reviewed: [facility]ed [facility] from 12/01/21 for annual physical. _________________________  Lisa Johnson is a 43 y.o. female who was seen in the ED for alcohol use disorder. On my initial evaluation, patient is intoxicated appearing.  She follows commands and answers some questions appropriately, but is otherwise unable to provide specific details regarding her presentation.  She admits to chronic alcohol use, states that she wants help with detox.  She also admits to regular marijuana use.  She reports passive suicidal thoughts.  Pertinent laboratory studies were obtained, with results listed in chart above.  Ethanol level was significantly elevated.  UDS is positive for THC.  Remainder of workup is unremarkable.  At this time, patient will require further observation, with reassessment for clinical sobriety, at which point she will be cleared for behavioral health assessment.  Elopement precautions were  placed.  Lisa Johnson's case was discussed with Dr. Jeneal, who has agreed  to assume care and provide appropriate disposition.  We discussed the history, physical exam findings, completed and pending laboratory results and imaging studies.  We have also discussed the current treatment plan and expected clinical course. Please refer to their chart for Lisa Johnson's remaining Emergency Department course, final disposition and clinical impression(s).  ED Clinical Impression   1. Alcoholic intoxication with complication (CMD)   2. Suicidal ideations     _________________ Scribe's Attestation: Dr. Zackowski obtained and performed the history, physical exam and medical decision making elements that were entered into the chart. Documentation assistance was provided by me personally, a scribe. Signed by Chiquita FELIX Jacques, Scribe on 06/13/2023 6:31 AM  Documentation assistance provided by the scribe. I was present during the time the encounter was recorded. The information recorded by the scribe was done at my direction and has been reviewed and validated by me. William Zackowski, DO  (Please note that portions of this note have been completed with Dragon voice recognition software.  Efforts were made to correct any errors, but occasionally words are mis-transcribed.)

## 2023-07-31 ENCOUNTER — Other Ambulatory Visit: Payer: Self-pay | Admitting: Internal Medicine

## 2023-07-31 DIAGNOSIS — Z1231 Encounter for screening mammogram for malignant neoplasm of breast: Secondary | ICD-10-CM

## 2023-08-17 ENCOUNTER — Ambulatory Visit: Payer: Medicaid Other

## 2023-08-21 ENCOUNTER — Ambulatory Visit

## 2023-09-08 ENCOUNTER — Ambulatory Visit

## 2023-12-31 NOTE — Progress Notes (Addendum)
 ANNUAL EXAM Patient name: Lisa Johnson MRN [id number]  Date of birth: 1980-10-31 Chief Complaint:   No chief complaint on file.  History of Present Illness:   Lisa Johnson is a 43 y.o. [id number] female being seen today for a routine annual exam.   Current complaints: vaginal discharge   Patient's last menstrual period was 07/29/2013.   The pregnancy intention screening data noted above was reviewed. Potential methods of contraception were discussed. The patient elected to proceed with No data recorded.   Last pap 12/01/21. Results were: NILM w/ HRHPV negative. H/O abnormal pap: no Last mammogram: 05/27/20. Results were: normal. Family h/o breast cancer: no Last colonoscopy: Never previously done due to age. Family h/o colorectal cancer: no STI screening: Accepts Contraception: Abdominal hysterectomy     12/01/2021    8:42 AM 05/02/2014    8:25 AM 04/04/2014    3:29 PM 02/28/2014    9:16 AM 02/25/2014   11:12 AM  Depression screen PHQ 2/9  Decreased Interest 0 0 0 0 0  Down, Depressed, Hopeless 1 0 0 0 0  PHQ - 2 Score 1 0 0 0 0  Altered sleeping 1      Tired, decreased energy 0      Change in appetite 0      Feeling bad or failure about yourself  1      Moving slowly or fidgety/restless 0      Suicidal thoughts 1      PHQ-9 Score 4            12/01/2021    8:46 AM  GAD 7 : Generalized Anxiety Score  Nervous, Anxious, on Edge 1  Control/stop worrying 1  Worry too much - different things 1  Trouble relaxing 1  Restless 1  Easily annoyed or irritable 1  Afraid - awful might happen 1  Total GAD 7 Score 7     Review of Systems:   Pertinent items are noted in HPI Denies any headaches, blurred vision, fatigue, shortness of breath, chest pain, abdominal pain, abnormal vaginal discharge/itching/odor/irritation, problems with periods, bowel movements, urination, or intercourse unless otherwise stated above. Pertinent History Reviewed:  Reviewed past  medical,surgical, social and family history.  Reviewed problem list, medications and allergies. Physical Assessment:  There were no vitals filed for this visit.There is no height or weight on file to calculate BMI.        Physical Examination:   General appearance - well appearing, and in no distress  Mental status - alert, oriented to person, place, and time  Psych:  She has a normal mood and affect  Skin - warm and dry, normal color, no suspicious lesions noted  Chest - effort normal, all lung fields clear to auscultation bilaterally  Heart - normal rate and regular rhythm  Neck:  midline trachea, no thyromegaly or nodules  Breasts - breasts appear normal, no suspicious masses, no skin or nipple changes or  axillary nodes  Abdomen - soft, nontender, nondistended, no masses or organomegaly  Pelvic - VULVA: normal appearing vulva with no masses, tenderness or lesions  VAGINA: normal appearing vagina with normal color and discharge, no lesions  CERVIX: normal appearing cervix without discharge or lesions, no CMT  UTERUS: uterus is felt to be normal size, shape, consistency and nontender   ADNEXA: No adnexal masses or tenderness noted.  Extremities:  No swelling or varicosities noted  Chaperone present for exam  No results found for this or any previous  visit (from the past 24 hours).  Assessment & Plan:  1. Encounter for annual routine gynecological examination (Primary) 2. Cervical cancer screening  - Cervical cancer screening: Discussed guidelines. Pap with HPV due 2028.   - STD Testing: declines - Birth Control: Hysterectomy - Breast Health: Encouraged self breast awareness/SBE. Teaching provided. Discussed limits of clinical breast exam for detecting breast cancer. MXR ordered.  - Colonoscopy: @ 45yo, or sooner if problems - F/U 12 months and prn   4. Positive depression screening Patient reports she did not understanding screening questions re: thoughts of SI, and marked it in  error. She reassured me no thoughts of self-harm, SI/HI. She is not seeing anyone for [facility] and is on no medications, but open to speaking to one of our counselors. [facility] for urgent [facility] care discussed and information included in AVS.   No orders of the defined types were placed in this encounter.  Meds: No orders of the defined types were placed in this encounter.  Follow-up: No follow-ups on file.  Lisa Johnson E Lisa Johnson, [place] 12/31/2023 9:21 PM

## 2024-01-02 ENCOUNTER — Encounter: Payer: Self-pay | Admitting: Physician Assistant

## 2024-01-02 ENCOUNTER — Other Ambulatory Visit (HOSPITAL_COMMUNITY)
Admission: RE | Admit: 2024-01-02 | Discharge: 2024-01-02 | Disposition: A | Discharge status: Home / Self Care | Source: Ambulatory Visit | Attending: Physician Assistant | Admitting: Physician Assistant

## 2024-01-02 ENCOUNTER — Ambulatory Visit (INDEPENDENT_AMBULATORY_CARE_PROVIDER_SITE_OTHER): Admitting: Physician Assistant

## 2024-01-02 VITALS — BP 110/78 | HR 76 | Ht 64.0 in | Wt 183.0 lb

## 2024-01-02 DIAGNOSIS — N760 Acute vaginitis: Secondary | ICD-10-CM | POA: Insufficient documentation

## 2024-01-02 DIAGNOSIS — Z01419 Encounter for gynecological examination (general) (routine) without abnormal findings: Secondary | ICD-10-CM

## 2024-01-02 DIAGNOSIS — B9689 Other specified bacterial agents as the cause of diseases classified elsewhere: Secondary | ICD-10-CM | POA: Insufficient documentation

## 2024-01-02 DIAGNOSIS — Z1331 Encounter for screening for depression: Secondary | ICD-10-CM | POA: Diagnosis not present

## 2024-01-02 DIAGNOSIS — Z0000 Encounter for general adult medical examination without abnormal findings: Secondary | ICD-10-CM

## 2024-01-02 DIAGNOSIS — Z113 Encounter for screening for infections with a predominantly sexual mode of transmission: Secondary | ICD-10-CM | POA: Insufficient documentation

## 2024-01-02 DIAGNOSIS — Z124 Encounter for screening for malignant neoplasm of cervix: Secondary | ICD-10-CM

## 2024-01-04 LAB — CERVICOVAGINAL ANCILLARY ONLY
Bacterial Vaginitis (gardnerella): POSITIVE — AB
Candida Glabrata: NEGATIVE
Candida Vaginitis: NEGATIVE
Comment: NEGATIVE
Comment: NEGATIVE
Comment: NEGATIVE
Comment: NEGATIVE
Trichomonas: NEGATIVE

## 2024-01-07 ENCOUNTER — Other Ambulatory Visit (INDEPENDENT_AMBULATORY_CARE_PROVIDER_SITE_OTHER): Payer: Self-pay | Admitting: Physician Assistant

## 2024-01-07 DIAGNOSIS — B9689 Other specified bacterial agents as the cause of diseases classified elsewhere: Secondary | ICD-10-CM | POA: Diagnosis not present

## 2024-01-07 DIAGNOSIS — N760 Acute vaginitis: Secondary | ICD-10-CM

## 2024-01-07 MED ORDER — METRONIDAZOLE 500 MG PO TABS: 500.0000 mg | ORAL_TABLET | Freq: Two times a day (BID) | ORAL | 0 refills | Status: AC

## 2024-01-07 MED ORDER — METRONIDAZOLE 0.75 % VA GEL
1.0000 | Freq: Every day | VAGINAL | 5 refills | Status: AC
Start: 2024-01-07 — End: ?

## 2024-01-07 NOTE — Progress Notes (Signed)
 Patient called re: recurrent BV. Discussed taking metronidazole  tablet by mouth then proceeding to vaginal gel treatment twice weekly for 6 months. Patient stated understanding.

## 2024-01-07 NOTE — Addendum Note (Signed)
 Addended by: Chrystel Barefield on: 01/07/2024 02:45 PM   Modules accepted: Orders

## 2024-01-07 NOTE — Patient Instructions (Signed)
 For urgent care concerning mental health go to:  [facility]  Address: [address], [place], [place] [address] Phone: [phone] Hours: Open 24 hours with no appointment necessary

## 2024-03-01 ENCOUNTER — Other Ambulatory Visit: Payer: Self-pay | Admitting: Internal Medicine

## 2024-03-01 DIAGNOSIS — L663 Perifolliculitis capitis abscedens: Secondary | ICD-10-CM | POA: Diagnosis not present

## 2024-03-01 DIAGNOSIS — N63 Unspecified lump in unspecified breast: Secondary | ICD-10-CM

## 2024-03-01 DIAGNOSIS — N6310 Unspecified lump in the right breast, unspecified quadrant: Secondary | ICD-10-CM | POA: Diagnosis not present

## 2024-03-13 ENCOUNTER — Encounter: Payer: Self-pay | Admitting: Internal Medicine

## 2024-03-13 DIAGNOSIS — N6310 Unspecified lump in the right breast, unspecified quadrant: Secondary | ICD-10-CM | POA: Diagnosis not present
